# Patient Record
Sex: Female | Born: 1995 | Race: Black or African American | Hispanic: No | Marital: Single
Health system: Southern US, Community
[De-identification: ages and names within clinical notes are randomized; demographics above are authoritative.]

## PROBLEM LIST (undated history)

## (undated) DIAGNOSIS — I10 Essential (primary) hypertension: Secondary | ICD-10-CM

## (undated) DIAGNOSIS — L0291 Cutaneous abscess, unspecified: Secondary | ICD-10-CM

## (undated) DIAGNOSIS — F99 Mental disorder, not otherwise specified: Secondary | ICD-10-CM

## (undated) HISTORY — DX: Mental disorder, not otherwise specified: F99

## (undated) HISTORY — DX: Essential (primary) hypertension: I10

---
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
## 2016-01-15 ENCOUNTER — Ambulatory Visit
Admission: EM | Admit: 2016-01-15 | Discharge: 2016-01-15 | Disposition: A | Discharge status: Home / Self Care | Payer: BLUE CROSS/BLUE SHIELD | Attending: Family Medicine | Admitting: Family Medicine

## 2016-01-15 DIAGNOSIS — J020 Streptococcal pharyngitis: Secondary | ICD-10-CM | POA: Diagnosis not present

## 2016-01-15 LAB — RAPID STREP SCREEN (MED CTR MEBANE ONLY): Streptococcus, Group A Screen (Direct): POSITIVE — AB

## 2016-01-15 MED ORDER — DEXAMETHASONE SODIUM PHOSPHATE 10 MG/ML IJ SOLN
10.0000 mg | Freq: Once | INTRAMUSCULAR | Status: AC
  Administered 2016-01-15: 10 mg via INTRAMUSCULAR

## 2016-01-15 MED ORDER — AMOXICILLIN 875 MG PO TABS: 875.0000 mg | ORAL_TABLET | Freq: Two times a day (BID) | ORAL | 0 refills | Status: DC

## 2016-01-15 NOTE — ED Provider Notes (Signed)
[facility] ____________________________________________  Time seen: Approximately 8:49 AM  I have reviewed the triage vital signs and the nursing notes.   HISTORY  Chief Complaint Sore Throat   HPI Virginia Orozco is a 20 y.o. female presents with a complaint of sore throat 2 days. Patient reports yesterday she had a 101 fever accompanying this. Patient also feels that her lymph nodes in her neck are swollen. Denies any other complaints symptoms such as cough, nasal congestion, shortness breath, neck or back pain, rash, chest pain or abdominal pain.  Denies any known sick contacts. Reports does continue to drink fluids but not eating as much due to the complaints of sore throat. Patient states sore throat is moderate this time. Denies recent sickness, recent antibiotic use or recent hospitalization.  [facility] PCP  No LMP recorded. Patient has had an injection. States not sexually active. Denies chance of pregnancy. Depo-Provera use.    History reviewed. No pertinent past medical history.  There are no active problems to display for this patient.   History reviewed. No pertinent surgical history.  No current facility-administered medications for this encounter.   Current Outpatient Prescriptions:  .  medroxyPROGESTERone (DEPO-PROVERA) 150 MG/ML injection, Inject 150 mg into the muscle every 3 (three) months., Disp: , Rfl:  .  amoxicillin (AMOXIL) 875 MG tablet, Take 1 tablet (875 mg total) by mouth 2 (two) times daily., Disp: 20 tablet, Rfl: 0  Allergies Review of patient's allergies indicates no known allergies.  History reviewed. No pertinent family history.  Social History Social History  Substance Use Topics  . Smoking status: Never Smoker  . Smokeless tobacco: Never Used  . Alcohol use No    Review of Systems Constitutional: As above. Eyes: No visual changes. ENT: Positive sore throat. Cardiovascular: Denies chest pain. Respiratory:  Denies shortness of breath. Gastrointestinal: No abdominal pain.  No nausea, no vomiting.  No diarrhea.  No constipation. Genitourinary: Negative for dysuria. Musculoskeletal: Negative for back pain. Skin: Negative for rash. Neurological: Negative for headaches, focal weakness or numbness.  10-point ROS otherwise negative.  ____________________________________________   PHYSICAL EXAM:  VITAL SIGNS: ED Triage Vitals  Enc Vitals Group     BP 01/15/16 0833 (!) 136/97     Pulse Rate 01/15/16 0833 (!) 118 Recheck 98     Resp 01/15/16 0833 20     Temp 01/15/16 0833 98.1 F (36.7 C)     Temp Source 01/15/16 0833 Oral     SpO2 01/15/16 0833 100 %     Weight 01/15/16 0848 210 lb (95.3 kg)     Height 01/15/16 0848 5\' 5"  (1.651 m)     Head Circumference --      Peak Flow --      Pain Score 01/15/16 0838 8     Pain Loc --      Pain Edu? --      Excl. in [place]? --    Constitutional: Alert and oriented. Well appearing and in no acute distress. Eyes: Conjunctivae are normal. PERRL. EOMI. Head: Atraumatic. No sinus tenderness to palpation. No swelling. No erythema.  Ears: no erythema, normal TMs bilaterally.   Nose:No nasal congestion or rhinorrhea.  Mouth/Throat: Mucous membranes are moist. Moderate pharyngeal erythema, with 2+ bilateral tonsillar swelling and exudate. No uvular shift or deviation.  Neck: No stridor.  No cervical spine tenderness to palpation. Hematological/Lymphatic/Immunilogical: Anterior cervical lymphadenopathy. Cardiovascular: Normal rate, regular rhythm. Grossly normal heart sounds.  Good peripheral circulation. Respiratory: Normal respiratory  effort.  No retractions. Lungs CTAB.No wheezes, rales or rhonchi. Good air movement.  Gastrointestinal: Soft and nontender. Normal Bowel sounds. No CVA tenderness. No hepatomegaly or splenomegaly palpated.  Musculoskeletal: No lower or upper extremity tenderness nor edema. No cervical, thoracic or lumbar tenderness to  palpation. Neurologic:  Normal speech and language. No gross focal neurologic deficits are appreciated. No gait instability. Skin:  Skin is warm, dry and intact. No rash noted. Psychiatric: Mood and affect are normal. Speech and behavior are normal.  ___________________________________________   LABS (all labs ordered are listed, but only abnormal results are displayed)  Labs Reviewed  RAPID STREP SCREEN (NOT AT [facility]) - Abnormal; Notable for the following:       Result Value   Streptococcus, Group A Screen (Direct) POSITIVE (*)    All other components within normal limits   Procedures   _______________________________________   INITIAL IMPRESSION / ASSESSMENT AND PLAN / ED COURSE  Pertinent labs & imaging results that were available during my care of the patient were reviewed by me and considered in my medical decision making (see chart for details).  Well-appearing patient. No acute distress. Presents for complaints of sore throat 2 days with accompanying fever. Tolerating oral fluids in exam room. Exudative tonsillitis, suspect streptococcal pharyngitis. Quick strep positive. Discussed options of treatment with patient. 10 mg IM Decadron given once in urgent care. Patient requests oral treatment with amoxicillin. Encourage rest, fluids, over-the-counter Tylenol or ibuprofen as needed.Discussed indication, risks and benefits of medications with patient.  Discussed follow up with Primary care physician this week. Discussed follow up and return parameters including no resolution or any worsening concerns. Patient verbalized understanding and agreed to plan.   ____________________________________________   FINAL CLINICAL IMPRESSION(S) / ED DIAGNOSES  Final diagnoses:  Strep pharyngitis     Discharge Medication List as of 01/15/2016  8:58 AM    START taking these medications   Details  amoxicillin (AMOXIL) 875 MG tablet Take 1 tablet (875 mg total) by mouth 2 (two) times  daily., Starting Sun 01/15/2016, Normal        Note: This dictation was prepared with Dragon dictation along with smaller phrase technology. Any transcriptional errors that result from this process are unintentional.    Clinical Course      Renford Dills, NP 01/15/16 2841    Renford Dills, NP 01/15/16 [phone]

## 2016-01-15 NOTE — Discharge Instructions (Signed)
Take medication as prescribed. Rest. Drink plenty of fluids. Take over the counter tylenol or ibuprofen as needed.   Follow up with your primary care physician this week as needed. Return to Urgent care for new or worsening concerns.

## 2016-01-15 NOTE — ED Triage Notes (Signed)
Pt reports starting Friday with sore throat, worsening. Lymph nodes tender, has had fever and chills. No cough.

## 2016-01-16 ENCOUNTER — Telehealth: Payer: Self-pay | Admitting: *Deleted

## 2016-01-16 MED ORDER — PENICILLIN V POTASSIUM 500 MG PO TABS: 500.0000 mg | ORAL_TABLET | Freq: Two times a day (BID) | ORAL | 0 refills | Status: AC

## 2016-01-16 NOTE — Telephone Encounter (Signed)
Patient called and reported constant diarrhea since starting the amox 875 she was prescribed for strep throat. Patient was directed to stop taking amox and a new prescription for penn v was sent to her pharmacy on record. Patient notified that new prescription called into pharmacy.

## 2018-02-17 ENCOUNTER — Emergency Department (HOSPITAL_COMMUNITY): Payer: BLUE CROSS/BLUE SHIELD

## 2018-02-17 ENCOUNTER — Encounter (HOSPITAL_COMMUNITY): Payer: Self-pay | Admitting: Emergency Medicine

## 2018-02-17 ENCOUNTER — Emergency Department (HOSPITAL_COMMUNITY)
Admission: EM | Admit: 2018-02-17 | Discharge: 2018-02-17 | Disposition: A | Discharge status: Home / Self Care | Payer: BLUE CROSS/BLUE SHIELD | Attending: Emergency Medicine | Admitting: Emergency Medicine

## 2018-02-17 DIAGNOSIS — M549 Dorsalgia, unspecified: Secondary | ICD-10-CM

## 2018-02-17 DIAGNOSIS — M546 Pain in thoracic spine: Secondary | ICD-10-CM | POA: Insufficient documentation

## 2018-02-17 DIAGNOSIS — R109 Unspecified abdominal pain: Secondary | ICD-10-CM | POA: Diagnosis present

## 2018-02-17 DIAGNOSIS — M7918 Myalgia, other site: Secondary | ICD-10-CM | POA: Diagnosis not present

## 2018-02-17 DIAGNOSIS — Z79899 Other long term (current) drug therapy: Secondary | ICD-10-CM | POA: Diagnosis not present

## 2018-02-17 LAB — POC URINE PREG, ED: Preg Test, Ur: NEGATIVE

## 2018-02-17 LAB — URINALYSIS, ROUTINE W REFLEX MICROSCOPIC
Bilirubin Urine: NEGATIVE
Glucose, UA: NEGATIVE mg/dL
Hgb urine dipstick: NEGATIVE
KETONES UR: NEGATIVE mg/dL
LEUKOCYTES UA: NEGATIVE
NITRITE: NEGATIVE
PH: 5 (ref 5.0–8.0)
PROTEIN: NEGATIVE mg/dL
Specific Gravity, Urine: 1.018 (ref 1.005–1.030)

## 2018-02-17 MED ORDER — IBUPROFEN 200 MG PO TABS
600.0000 mg | ORAL_TABLET | Freq: Once | ORAL | Status: AC
  Administered 2018-02-17: 600 mg via ORAL
  Filled 2018-02-17: qty 1

## 2018-02-17 MED ORDER — IBUPROFEN 600 MG PO TABS: 600.0000 mg | ORAL_TABLET | Freq: Four times a day (QID) | ORAL | 0 refills | Status: DC | PRN

## 2018-02-17 MED ORDER — HYDROCODONE-ACETAMINOPHEN 5-325 MG PO TABS
1.0000 | ORAL_TABLET | Freq: Once | ORAL | Status: AC
  Administered 2018-02-17: 1 via ORAL
  Filled 2018-02-17: qty 1

## 2018-02-17 MED ORDER — CYCLOBENZAPRINE HCL 10 MG PO TABS: 10.0000 mg | ORAL_TABLET | Freq: Two times a day (BID) | ORAL | 0 refills | Status: DC | PRN

## 2018-02-17 MED ORDER — DIAZEPAM 5 MG PO TABS
5.0000 mg | ORAL_TABLET | Freq: Once | ORAL | Status: AC
  Administered 2018-02-17: 5 mg via ORAL
  Filled 2018-02-17: qty 1

## 2018-02-17 NOTE — ED Provider Notes (Signed)
[facility] Provider Note   CSN: [id number] Arrival date & time: 02/17/18  0309     History   Chief Complaint Chief Complaint  Patient presents with  . Flank Pain    HPI Virginia PatesMariah Orozco is a 22 y.o. female.  Patient without significant medical history presents with right flank pain x 3 days with intermittent radiation to low back. No AP, N, V, D, urinary symptoms. No fever, cough, SOB. Worse with movement, better with rest.  She describes constant pain that is intermittently intense and grabbing pain.   The history is provided by the patient. No language interpreter was used.    History reviewed. No pertinent past medical history.  There are no active problems to display for this patient.   History reviewed. No pertinent surgical history.   OB History   None      Home Medications    Prior to Admission medications   Medication Sig Start Date End Date Taking? Authorizing Provider  amoxicillin (AMOXIL) 875 MG tablet Take 1 tablet (875 mg total) by mouth 2 (two) times daily. 01/15/16   Renford DillsMiller, Lindsey, NP  medroxyPROGESTERone (DEPO-PROVERA) 150 MG/ML injection Inject 150 mg into the muscle every 3 (three) months.    [provider]    Family History No family history on file.  Social History Social History   Tobacco Use  . Smoking status: Never Smoker  . Smokeless tobacco: Never Used  Substance Use Topics  . Alcohol use: No  . Drug use: Not Currently     Allergies   Patient has no known allergies.   Review of Systems Review of Systems  Constitutional: Negative for chills and fever.  HENT: Negative.   Respiratory: Negative.  Negative for cough and shortness of breath.   Cardiovascular: Negative.  Negative for chest pain.  Gastrointestinal: Negative.  Negative for abdominal pain and nausea.  Genitourinary: Negative.  Negative for dysuria.  Musculoskeletal: Positive for back pain.  Skin: Negative.  Negative  for color change and rash.  Neurological: Negative.      Physical Exam Updated Vital Signs BP (!) 152/87   Pulse 85   Temp 98 F (36.7 C) (Oral)   Resp 16   Ht 5\' 5"  (1.651 m)   Wt 90.7 kg   SpO2 99%   BMI 33.28 kg/m   Physical Exam  Constitutional: She is oriented to person, place, and time. She appears well-developed and well-nourished. No distress.  HENT:  Head: Normocephalic.  Neck: Normal range of motion. Neck supple.  Cardiovascular: Normal rate and regular rhythm.  Pulmonary/Chest: Effort normal and breath sounds normal. She has no wheezes. She has no rales. She exhibits no tenderness.  Abdominal: Soft. Bowel sounds are normal. There is no tenderness. There is no rebound and no guarding.  Musculoskeletal: Normal range of motion.       Back:  Neurological: She is alert and oriented to person, place, and time.  Skin: Skin is warm and dry. No rash noted.  Psychiatric: She has a normal mood and affect.     ED Treatments / Results  Labs (all labs ordered are listed, but only abnormal results are displayed) Labs Reviewed  URINALYSIS, ROUTINE W REFLEX MICROSCOPIC  POC URINE PREG, ED    EKG None  Radiology No results found.  Procedures Procedures (including critical care time)  Medications Ordered in ED Medications - No data to display   Initial Impression / Assessment and Plan / ED Course  I have reviewed the triage vital signs and the nursing notes.  Pertinent labs & imaging results that were available during my care of the patient were reviewed by me and considered in my medical decision making (see chart for details).     Patient is here with 3 days of right sided back pain without known injury. No cough, fever, chest/abdominal pain.  The patient is PERC negative. The pain is reproducible and follows muscular pattern. UA negative.   Will treat symptomatically and encourage PCP follow up if symptoms persist.   Final Clinical Impressions(s) / ED  Diagnoses   Final diagnoses:  None  1. Back pain 2. Musculoskeletal pain  ED Discharge Orders    None       Elpidio Anis, Cordelia Poche 02/17/18 1610    Glynn Octave, MD 02/17/18 [phone]

## 2018-02-17 NOTE — ED Triage Notes (Signed)
Pt reports R sided flank pain since Friday. Denies urinary s/s. Hypertensive in triage.

## 2018-02-17 NOTE — ED Notes (Signed)
PT states understanding of care given, follow up care, and medication prescribed. PT ambulated from ED to car with a steady gait. 

## 2018-04-16 DIAGNOSIS — I10 Essential (primary) hypertension: Secondary | ICD-10-CM | POA: Insufficient documentation

## 2018-08-13 ENCOUNTER — Encounter (HOSPITAL_COMMUNITY): Payer: Self-pay

## 2018-08-13 ENCOUNTER — Emergency Department (HOSPITAL_COMMUNITY)
Admission: EM | Admit: 2018-08-13 | Discharge: 2018-08-13 | Discharge status: Against Medical Advice | Payer: BLUE CROSS/BLUE SHIELD | Attending: Emergency Medicine | Admitting: Emergency Medicine

## 2018-08-13 ENCOUNTER — Ambulatory Visit (HOSPITAL_COMMUNITY)
Admission: EM | Admit: 2018-08-13 | Discharge: 2018-08-13 | Disposition: A | Discharge status: Home / Self Care | Payer: BLUE CROSS/BLUE SHIELD | Source: Home / Self Care

## 2018-08-13 ENCOUNTER — Other Ambulatory Visit: Payer: Self-pay

## 2018-08-13 DIAGNOSIS — Z5321 Procedure and treatment not carried out due to patient leaving prior to being seen by health care provider: Secondary | ICD-10-CM | POA: Insufficient documentation

## 2018-08-13 DIAGNOSIS — R109 Unspecified abdominal pain: Secondary | ICD-10-CM | POA: Diagnosis present

## 2018-08-13 LAB — URINALYSIS, ROUTINE W REFLEX MICROSCOPIC
BILIRUBIN URINE: NEGATIVE
Glucose, UA: NEGATIVE mg/dL
Hgb urine dipstick: NEGATIVE
Ketones, ur: NEGATIVE mg/dL
LEUKOCYTE UA: NEGATIVE
NITRITE: NEGATIVE
PH: 7 (ref 5.0–8.0)
Protein, ur: NEGATIVE mg/dL
Specific Gravity, Urine: 1.02 (ref 1.005–1.030)

## 2018-08-13 LAB — I-STAT BETA HCG BLOOD, ED (MC, WL, AP ONLY)

## 2018-08-13 LAB — LIPASE, BLOOD: LIPASE: 24 U/L (ref 11–51)

## 2018-08-13 LAB — COMPREHENSIVE METABOLIC PANEL
ALT: 20 U/L (ref 0–44)
ANION GAP: 8 (ref 5–15)
AST: 25 U/L (ref 15–41)
Albumin: 4 g/dL (ref 3.5–5.0)
Alkaline Phosphatase: 42 U/L (ref 38–126)
BILIRUBIN TOTAL: 0.4 mg/dL (ref 0.3–1.2)
BUN: 8 mg/dL (ref 6–20)
CHLORIDE: 108 mmol/L (ref 98–111)
CO2: 24 mmol/L (ref 22–32)
Calcium: 9.3 mg/dL (ref 8.9–10.3)
Creatinine, Ser: 0.67 mg/dL (ref 0.44–1.00)
GFR calc non Af Amer: >60 mL/min (ref >60)
Glucose, Bld: 101 mg/dL — ABNORMAL HIGH (ref 70–99)
POTASSIUM: 3.8 mmol/L (ref 3.5–5.1)
Sodium: 140 mmol/L (ref 135–145)
Total Protein: 7 g/dL (ref 6.5–8.1)

## 2018-08-13 LAB — CBC
HEMATOCRIT: 37.7 % (ref 36.0–46.0)
Hemoglobin: 12.9 g/dL (ref 12.0–15.0)
MCH: 30.1 pg (ref 26.0–34.0)
MCHC: 34.2 g/dL (ref 30.0–36.0)
MCV: 87.9 fL (ref 80.0–100.0)
NRBC: 0 % (ref 0.0–0.2)
Platelets: 303 10*3/uL (ref 150–400)
RBC: 4.29 MIL/uL (ref 3.87–5.11)
RDW: 11.7 % (ref 11.5–15.5)
WBC: 19.8 10*3/uL — AB (ref 4.0–10.5)

## 2018-08-13 MED ORDER — SODIUM CHLORIDE 0.9% FLUSH: 3.0000 mL | Freq: Once | INTRAVENOUS | Status: DC

## 2018-08-13 NOTE — ED Triage Notes (Signed)
Pt reports abdominal pain that starts around her navel and radiates to her right flank area, describes as a stabbing pain. Pt also reports nausea that has resolved. Reports no BM in a couple of days. Pt reports urine has been dark.

## 2018-08-13 NOTE — ED Notes (Signed)
Called for room no answer X3

## 2018-08-13 NOTE — ED Notes (Signed)
Complains of sever right sided abdominal pain for an hour. Sudden onset.  Pain is constant.  No issues urination.  No vomiting, no diarrhea. Patient has nausea.  Pain is 8-10/10.  Patient appears to be in significant pain.  Patient has a friend with her.  Patient says it is painful in all positions.    Spoke with dr Milus Glazier about patient .  Patient agreed to go to ed for evaluation and possible imaging.

## 2019-02-12 ENCOUNTER — Emergency Department: Payer: BLUE CROSS/BLUE SHIELD

## 2019-02-12 ENCOUNTER — Other Ambulatory Visit: Payer: Self-pay

## 2019-02-12 ENCOUNTER — Emergency Department
Admission: EM | Admit: 2019-02-12 | Discharge: 2019-02-12 | Disposition: A | Discharge status: Home / Self Care | Payer: BLUE CROSS/BLUE SHIELD | Attending: Emergency Medicine | Admitting: Emergency Medicine

## 2019-02-12 ENCOUNTER — Encounter: Payer: Self-pay | Admitting: Emergency Medicine

## 2019-02-12 DIAGNOSIS — R109 Unspecified abdominal pain: Secondary | ICD-10-CM

## 2019-02-12 DIAGNOSIS — R630 Anorexia: Secondary | ICD-10-CM | POA: Insufficient documentation

## 2019-02-12 DIAGNOSIS — R1011 Right upper quadrant pain: Secondary | ICD-10-CM | POA: Diagnosis not present

## 2019-02-12 DIAGNOSIS — R1031 Right lower quadrant pain: Secondary | ICD-10-CM | POA: Insufficient documentation

## 2019-02-12 DIAGNOSIS — M549 Dorsalgia, unspecified: Secondary | ICD-10-CM | POA: Insufficient documentation

## 2019-02-12 DIAGNOSIS — R1010 Upper abdominal pain, unspecified: Secondary | ICD-10-CM

## 2019-02-12 LAB — COMPREHENSIVE METABOLIC PANEL
ALT: 19 U/L (ref 0–44)
AST: 21 U/L (ref 15–41)
Albumin: 4.4 g/dL (ref 3.5–5.0)
Alkaline Phosphatase: 38 U/L (ref 38–126)
Anion gap: 9 (ref 5–15)
BUN: 11 mg/dL (ref 6–20)
CO2: 23 mmol/L (ref 22–32)
Calcium: 9 mg/dL (ref 8.9–10.3)
Chloride: 105 mmol/L (ref 98–111)
Creatinine, Ser: 0.56 mg/dL (ref 0.44–1.00)
GFR calc Af Amer: >60 mL/min (ref >60)
GFR calc non Af Amer: >60 mL/min (ref >60)
Glucose, Bld: 95 mg/dL (ref 70–99)
Potassium: 3.6 mmol/L (ref 3.5–5.1)
Sodium: 137 mmol/L (ref 135–145)
Total Bilirubin: 0.7 mg/dL (ref 0.3–1.2)
Total Protein: 7.4 g/dL (ref 6.5–8.1)

## 2019-02-12 LAB — CBC
HCT: 38.1 % (ref 36.0–46.0)
Hemoglobin: 13.3 g/dL (ref 12.0–15.0)
MCH: 30.2 pg (ref 26.0–34.0)
MCHC: 34.9 g/dL (ref 30.0–36.0)
MCV: 86.6 fL (ref 80.0–100.0)
Platelets: 278 10*3/uL (ref 150–400)
RBC: 4.4 MIL/uL (ref 3.87–5.11)
RDW: 11.6 % (ref 11.5–15.5)
WBC: 10.3 10*3/uL (ref 4.0–10.5)
nRBC: 0 % (ref 0.0–0.2)

## 2019-02-12 LAB — URINALYSIS, COMPLETE (UACMP) WITH MICROSCOPIC
Bacteria, UA: NONE SEEN
Bilirubin Urine: NEGATIVE
Glucose, UA: NEGATIVE mg/dL
Hgb urine dipstick: NEGATIVE
Ketones, ur: NEGATIVE mg/dL
Leukocytes,Ua: NEGATIVE
Nitrite: NEGATIVE
Protein, ur: 30 mg/dL — AB
Specific Gravity, Urine: 1.029 (ref 1.005–1.030)
pH: 7 (ref 5.0–8.0)

## 2019-02-12 LAB — POCT PREGNANCY, URINE: Preg Test, Ur: NEGATIVE

## 2019-02-12 LAB — LIPASE, BLOOD: Lipase: 23 U/L (ref 11–51)

## 2019-02-12 NOTE — ED Triage Notes (Signed)
Patient presents to ED via POV from home with c/o abdominal pain. Patient reports the pain began yesterday and started around her umbilicus and radiated to her right flank. Patient denies nausea or vomiting. Patient reports negative pregnancy test two weeks ago.

## 2019-02-12 NOTE — ED Provider Notes (Signed)
[facility] Emergency Department Provider Note  ____________________________________________   First MD Initiated Contact with Patient 02/12/19 1416     (approximate)  I have reviewed the triage vital signs and the nursing notes.   HISTORY  Chief Complaint Abdominal Pain    HPI Virginia Orozco is a 23 y.o. female presents emergency department complaining of mid abdominal pain.  Patient states pain is been ongoing for 2 weeks.  It will come and go.  Is burning type pain.  To the right lower quadrant and moves into her back but will also be in the right upper quadrant and moved into her back.  She has decreased appetite.  No vomiting or diarrhea.  No fever or chills.  No vaginal discharge.    History reviewed. No pertinent past medical history.  There are no active problems to display for this patient.   History reviewed. No pertinent surgical history.  Prior to Admission medications   Medication Sig Start Date End Date Taking? Authorizing Provider  medroxyPROGESTERone (DEPO-PROVERA) 150 MG/ML injection Inject 150 mg into the muscle every 3 (three) months.    [provider]    Allergies Patient has no known allergies.  No family history on file.  Social History Social History   Tobacco Use  . Smoking status: Never Smoker  . Smokeless tobacco: Never Used  Substance Use Topics  . Alcohol use: No  . Drug use: Not Currently    Review of Systems  Constitutional: No fever/chills Eyes: No visual changes. ENT: No sore throat. Respiratory: Denies cough Gastrointestinal: Positive for abdominal pain Genitourinary: Negative for dysuria. Musculoskeletal: Negative for back pain. Skin: Negative for rash.    ____________________________________________   PHYSICAL EXAM:  VITAL SIGNS: ED Triage Vitals  Enc Vitals Group     BP 02/12/19 1215 (!) 152/86     Pulse Rate 02/12/19 1215 80     Resp --      Temp 02/12/19 1215 98.1 F (36.7  C)     Temp Source 02/12/19 1215 Oral     SpO2 02/12/19 1215 100 %     Weight 02/12/19 1216 200 lb (90.7 kg)     Height 02/12/19 1216 5\' 5"  (1.651 m)     Head Circumference --      Peak Flow --      Pain Score 02/12/19 1252 8     Pain Loc --      Pain Edu? --      Excl. in [place]? --     Constitutional: Alert and oriented. Well appearing and in no acute distress. Eyes: Conjunctivae are normal.  Head: Atraumatic. Nose: No congestion/rhinnorhea. Mouth/Throat: Mucous membranes are moist.   Neck:  supple no lymphadenopathy noted Cardiovascular: Normal rate, regular rhythm. Heart sounds are normal Respiratory: Normal respiratory effort.  No retractions, lungs c t a  Abd: soft tender right upper to mid quadrant, no McBurney's point tenderness, bs normal all 4 quad GU: deferred Musculoskeletal: FROM all extremities, warm and well perfused Neurologic:  Normal speech and language.  Skin:  Skin is warm, dry and intact. No rash noted. Psychiatric: Mood and affect are normal. Speech and behavior are normal.  ____________________________________________   LABS (all labs ordered are listed, but only abnormal results are displayed)  Labs Reviewed  URINALYSIS, COMPLETE (UACMP) WITH MICROSCOPIC - Abnormal; Notable for the following components:      Result Value   Color, Urine YELLOW (*)    APPearance HAZY (*)  Protein, ur 30 (*)    All other components within normal limits  LIPASE, BLOOD  COMPREHENSIVE METABOLIC PANEL  CBC  POC URINE PREG, ED  POCT PREGNANCY, URINE   ____________________________________________   ____________________________________________  RADIOLOGY  CT renal stone study is negative for appendicitis or other acute etiology.   Ultrasound right upper quadrant shows normal gallbladder  ____________________________________________   PROCEDURES  Procedure(s) performed: No  Procedures    ____________________________________________   INITIAL IMPRESSION  / ASSESSMENT AND PLAN / ED COURSE  Pertinent labs & imaging results that were available during my care of the patient were reviewed by me and considered in my medical decision making (see chart for details).   Patient is 23 year old female presents emergency department with intermittent right-sided abdominal pain.  Physical exam patient appears well.  Vitals are normal.  Abdomen is soft and tender in the right upper quadrant.  No McBurney's point tenderness.  POC pregnancy is negative, comprehensive metabolic panel is normal, CBC is normal, lipase is normal, urinalysis is normal.  CT renal stone study is negative for any acute abnormality and does show a normal appendix. Ultrasound right upper quadrant shows a normal gallbladder  Explained all the findings to the patient.  Explained to her this could be gas or another entity of abdominal pain.  If she continues to have abdominal pain she should follow-up with GI.  She states she understands will comply.  She was given a work note as requested and discharged in stable condition.    Virginia Orozco was evaluated in Emergency Department on 02/12/2019 for the symptoms described in the history of present illness. She was evaluated in the context of the global COVID-19 pandemic, which necessitated consideration that the patient might be at risk for infection with the SARS-CoV-2 virus that causes COVID-19. Institutional protocols and algorithms that pertain to the evaluation of patients at risk for COVID-19 are in a state of rapid change based on information released by regulatory bodies including the CDC and federal and state organizations. These policies and algorithms were followed during the patient's care in the ED.   As part of my medical decision making, I reviewed the following data within the electronic MEDICAL RECORD NUMBER Nursing notes reviewed and incorporated, Labs reviewed see above, Old chart reviewed, Radiograph reviewed see above, Notes from  prior ED visits and [place] Controlled Substance Database  ____________________________________________   FINAL CLINICAL IMPRESSION(S) / ED DIAGNOSES  Final diagnoses:  Abdominal pain  Pain of upper abdomen      NEW MEDICATIONS STARTED DURING THIS VISIT:  Discharge Medication List as of 02/12/2019  4:26 PM       Note:  This document was prepared using Dragon voice recognition software and may include unintentional dictation errors.    Faythe GheeFisher,  W, PA-C 02/12/19 1659    Arnaldo NatalMalinda, Paul F, MD 02/16/19 2255

## 2019-02-12 NOTE — ED Notes (Signed)
See triage note  Presents with abd pain   States pain is behind belly button ,which is described as "burning" pain    States pain is RLQ and moves into back

## 2019-02-12 NOTE — Discharge Instructions (Signed)
Follow-up with Dr. Allen Norris if not better in 3 to 4 days.  Return emergency department if worsening.

## 2019-06-10 ENCOUNTER — Other Ambulatory Visit: Payer: Self-pay

## 2019-06-10 DIAGNOSIS — Z20822 Contact with and (suspected) exposure to covid-19: Secondary | ICD-10-CM

## 2019-06-11 LAB — NOVEL CORONAVIRUS, NAA: SARS-CoV-2, NAA: DETECTED — AB

## 2019-06-12 ENCOUNTER — Encounter: Payer: Self-pay | Admitting: Critical Care Medicine

## 2019-06-12 ENCOUNTER — Telehealth: Payer: Self-pay | Admitting: Critical Care Medicine

## 2019-06-12 DIAGNOSIS — E669 Obesity, unspecified: Secondary | ICD-10-CM | POA: Insufficient documentation

## 2019-06-12 DIAGNOSIS — F419 Anxiety disorder, unspecified: Secondary | ICD-10-CM | POA: Insufficient documentation

## 2019-06-12 NOTE — Telephone Encounter (Signed)
I connected with this patient who is Covid + January 6 she has mild symptoms.  She is not a monoclonal antibody candidate.  She knows the isolation time for 10 days.

## 2019-06-12 NOTE — Telephone Encounter (Signed)
I tried to call this patient is Covid positive from January 6 testing event.  Was not able to reach the patient and I left a message on her voicemail.  She is not a monoclonal antibody candidate

## 2019-10-17 IMAGING — US US ABDOMEN LIMITED
1 series · 14 of 22 positions shown · non-contrast
Comparison: CT of the abdomen pelvis dated 02/12/2019

CLINICAL DATA: 23-year-old female with right upper quadrant
abdominal pain.

EXAM:
ULTRASOUND ABDOMEN LIMITED RIGHT UPPER QUADRANT

[Series 1: us abdomen limited · 14 of 22 slices shown]
[im 1/22]
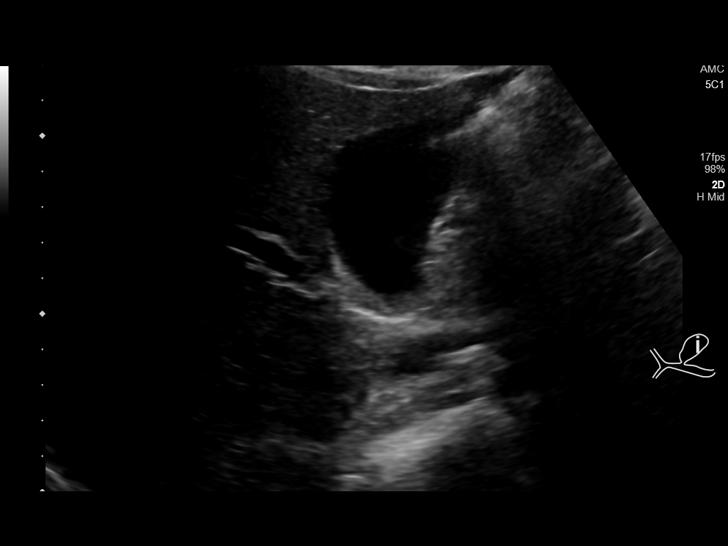
[im 3/22]
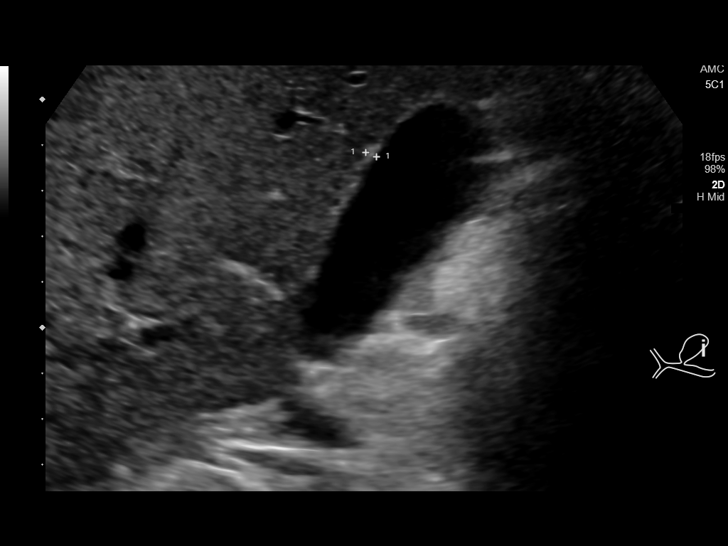
[im 4/22]
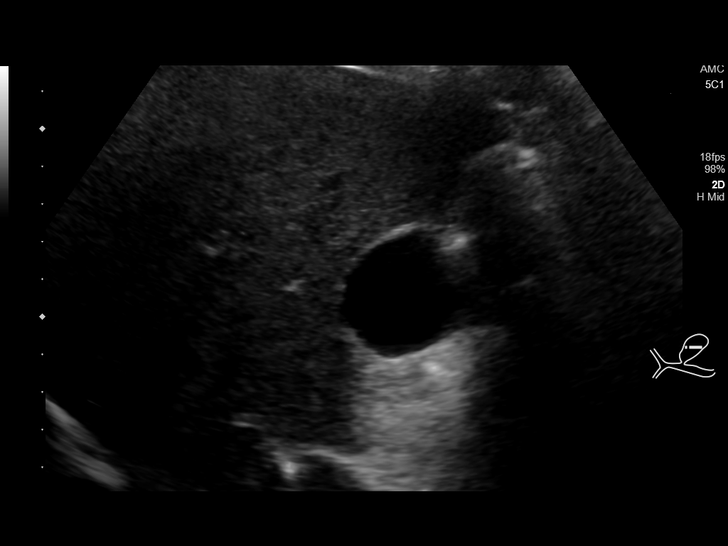
[im 6/22]
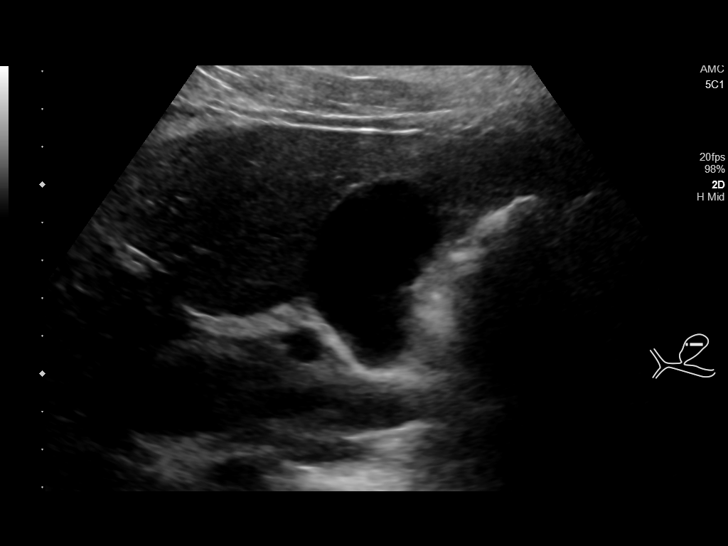
[im 8/22]
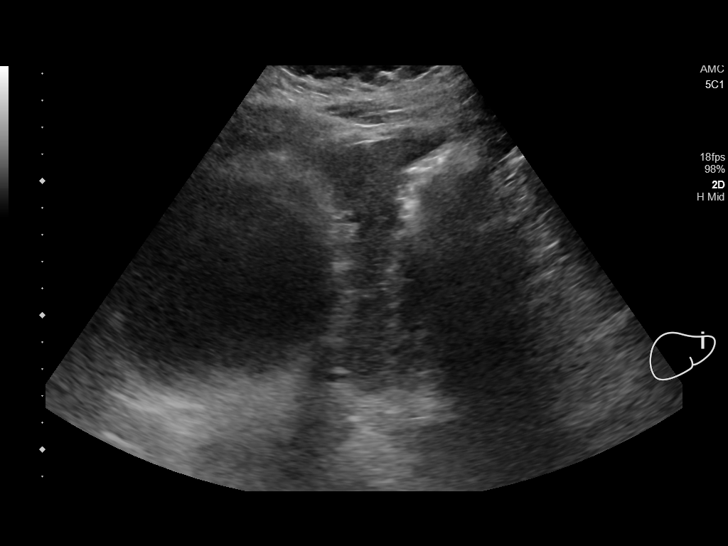
[im 9/22]
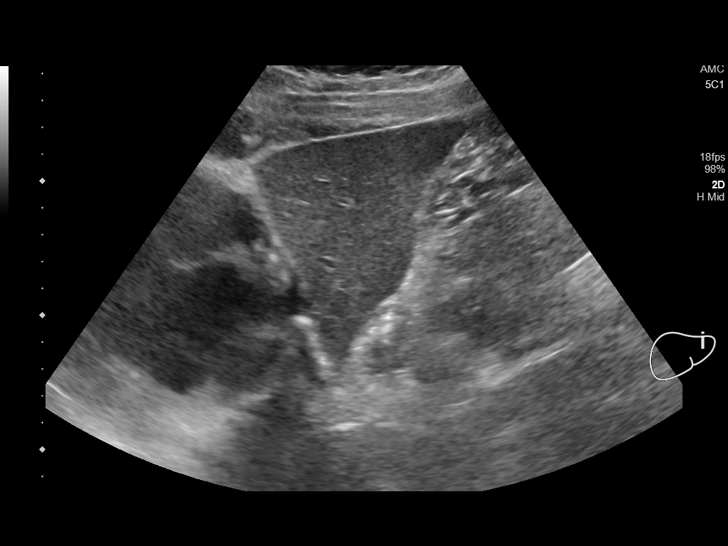
[im 11/22]
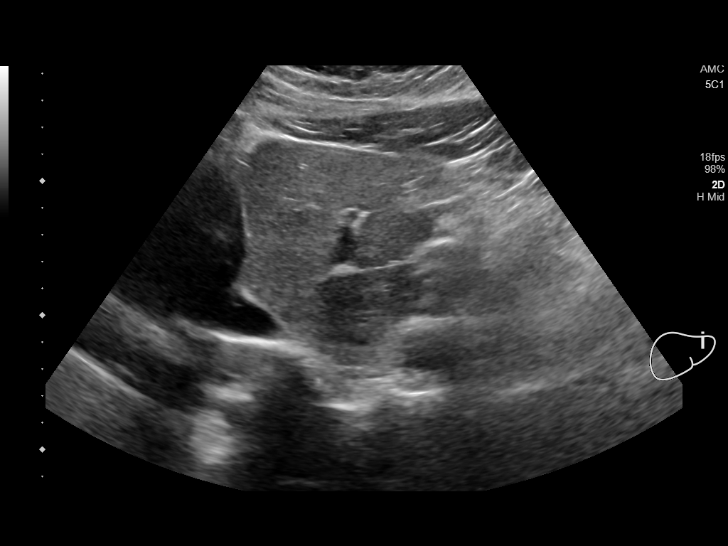
[im 12/22]
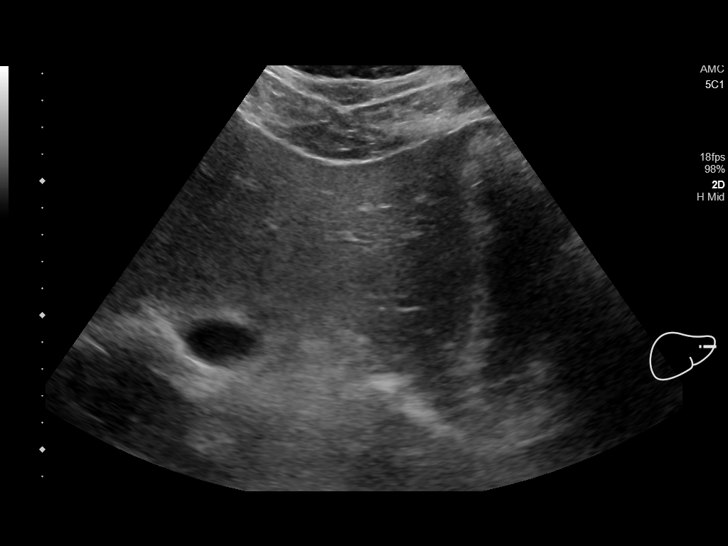
[im 14/22]
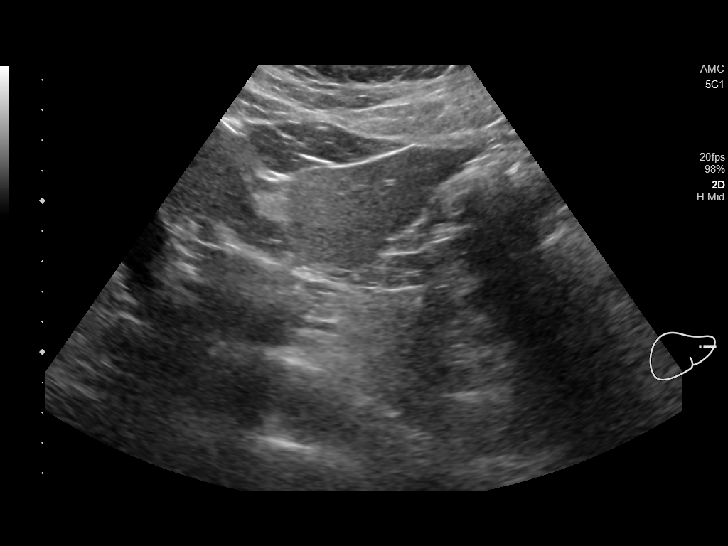
[im 15/22]
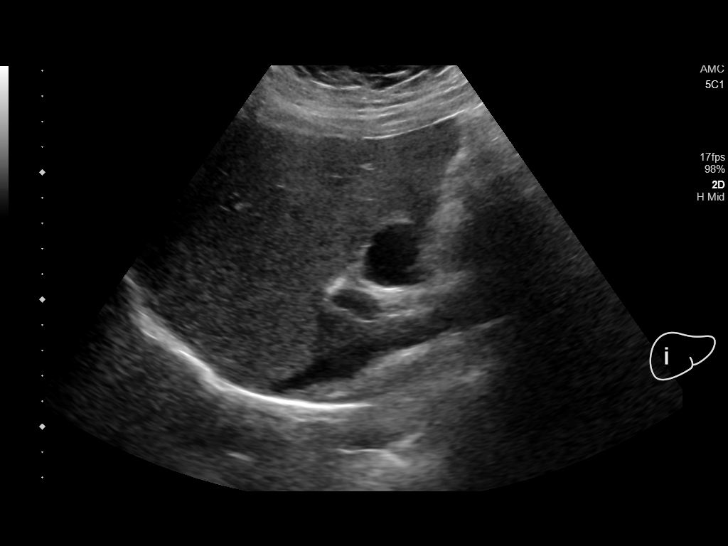
[im 17/22]
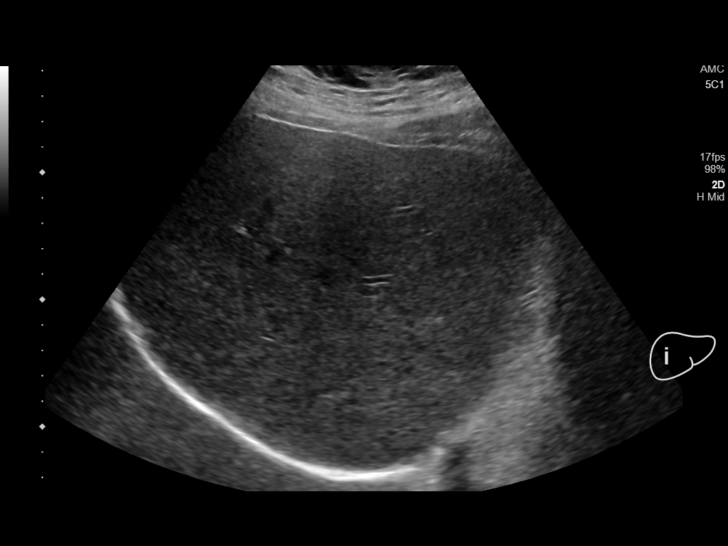
[im 19/22]
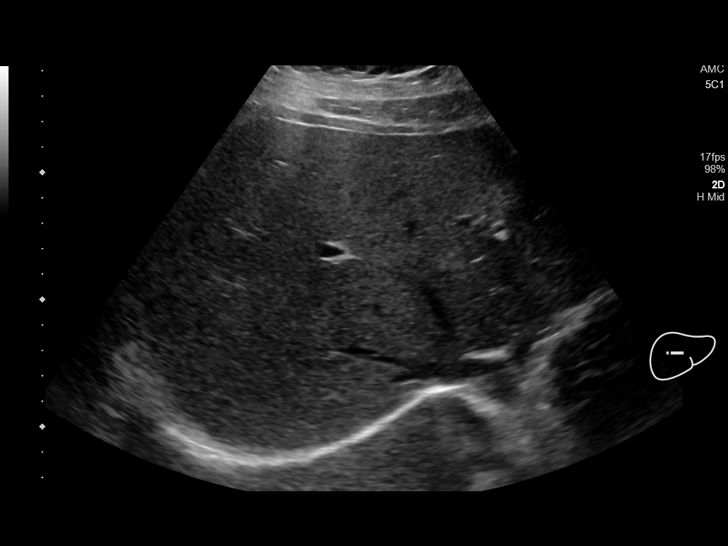
[im 20/22]
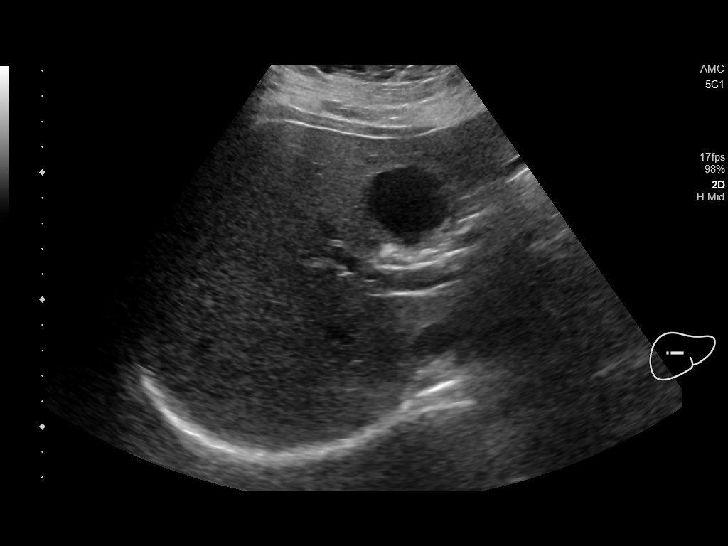
[im 22/22]
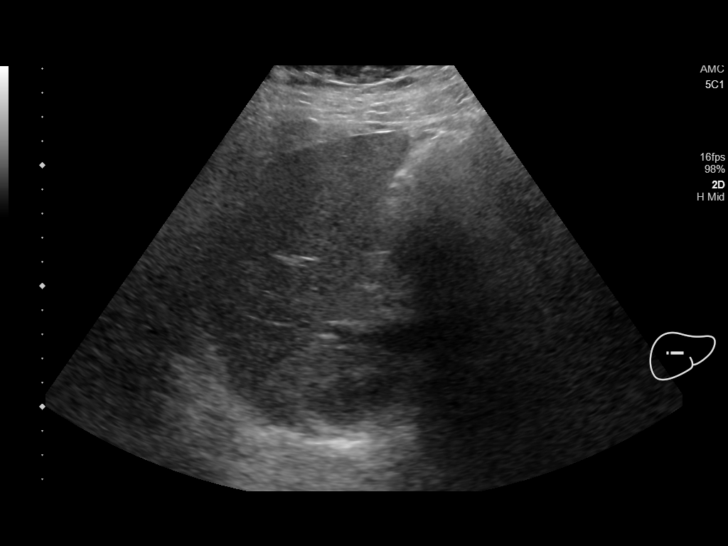

[14 of 22 positions shown; findings below may reference images not displayed]

FINDINGS: Gallbladder:

No gallstones or wall thickening visualized. No sonographic Murphy
sign noted by sonographer.

Common bile duct:

Diameter: 4 mm

Liver:

The liver is unremarkable as visualized. Portal vein is patent on
color Doppler imaging with normal direction of blood flow towards
the liver.

Other: None.
IMPRESSION: Unremarkable right upper quadrant ultrasound.

## 2019-10-17 IMAGING — CT CT RENAL STONE PROTOCOL
2 of 4 series · 16 of 46 positions shown, 18 images · non-contrast
Comparison: None.

CLINICAL DATA: Right flank pain for 1 day. Stone disease suspected.

EXAM:
CT ABDOMEN AND PELVIS WITHOUT CONTRAST
TECHNIQUE: Multidetector CT imaging of the abdomen and pelvis was performed
following the standard protocol without IV contrast.

[Series 2: stone full standard · axial · 0.73mm/px · z∈[-1047,-607]mm · 13 of 98 slices shown, 15 images]
[im 5/98  soft-tissue]
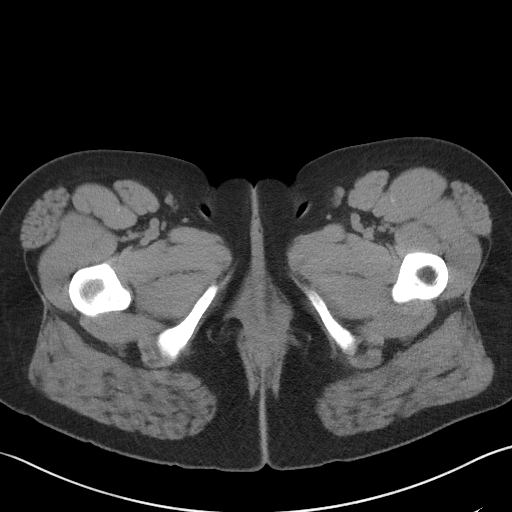
[im 5/98  bone]
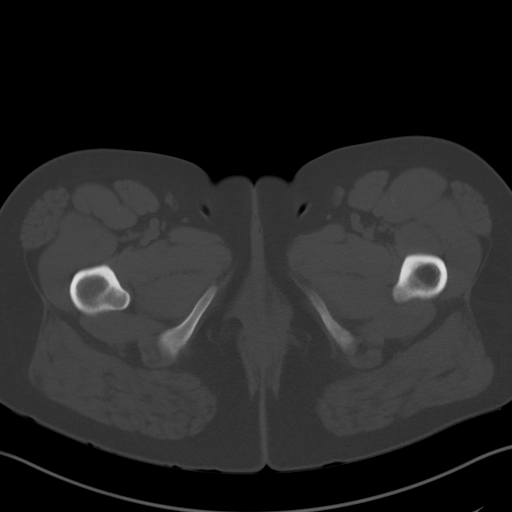
[im 13/98  soft-tissue]
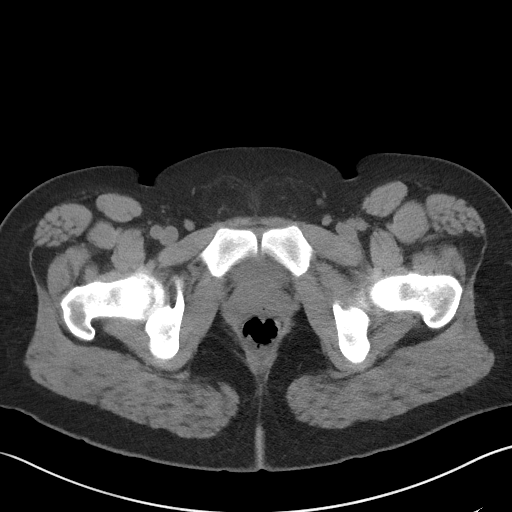
[im 21/98  soft-tissue]
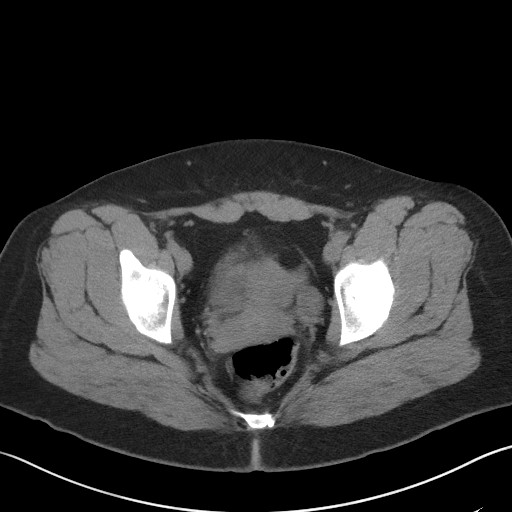
[im 29/98  soft-tissue]
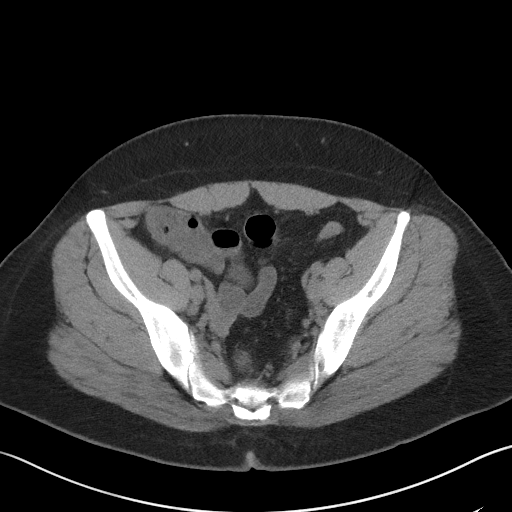
[im 33/98  soft-tissue]
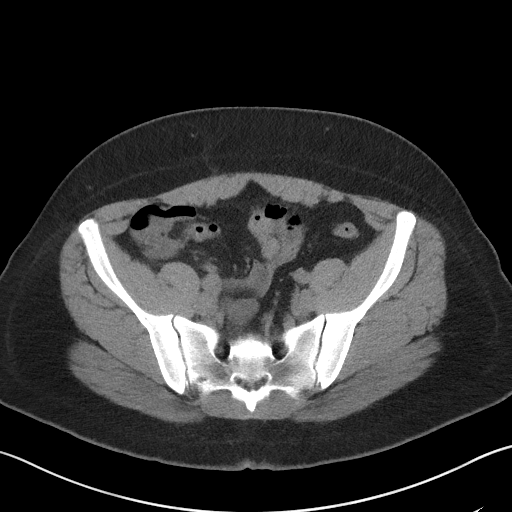
[im 41/98  soft-tissue]
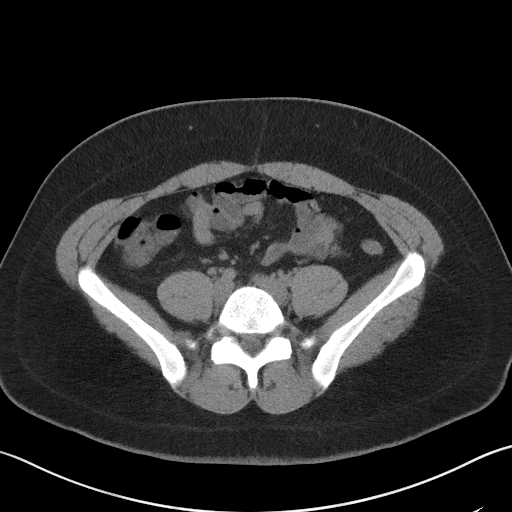
[im 49/98  soft-tissue]
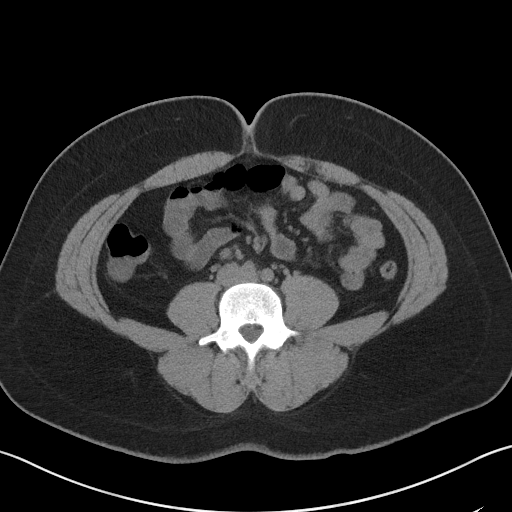
[im 57/98  soft-tissue]
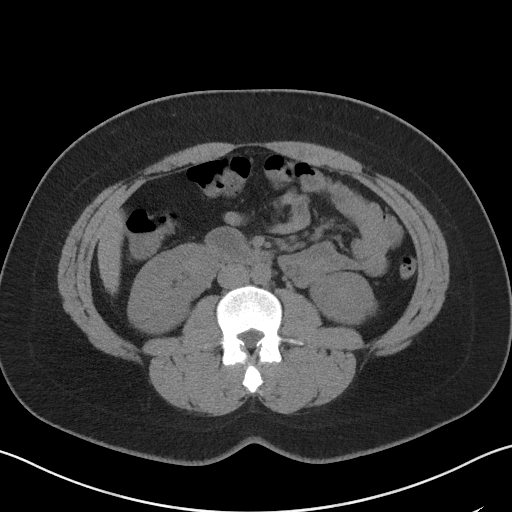
[im 65/98  soft-tissue]
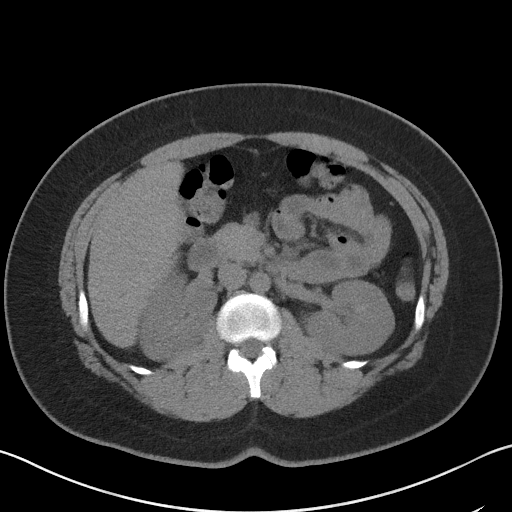
[im 65/98  bone]
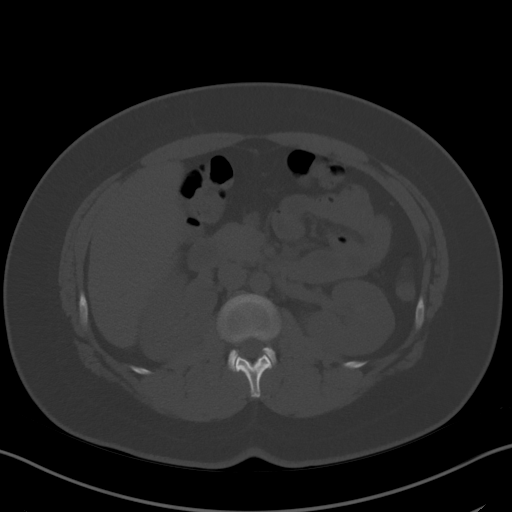
[im 69/98  soft-tissue]
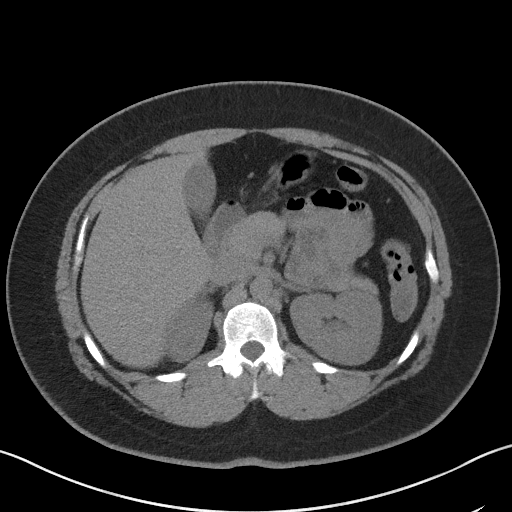
[im 77/98  soft-tissue]
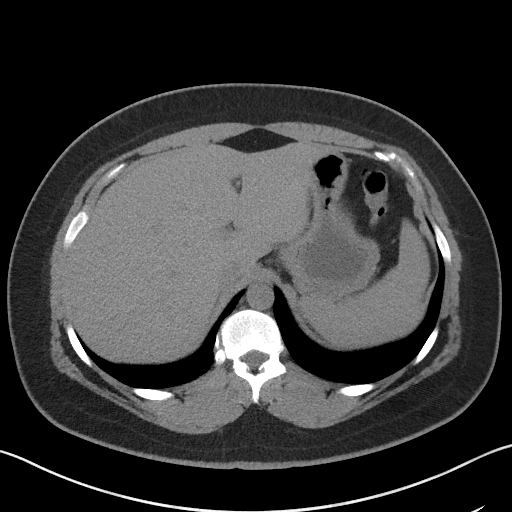
[im 85/98  soft-tissue]
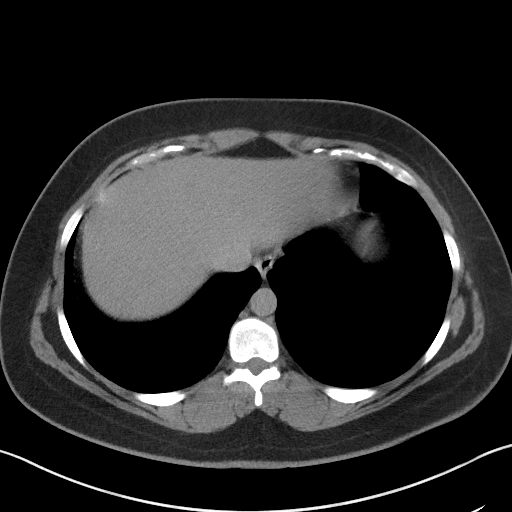
[im 93/98  soft-tissue]
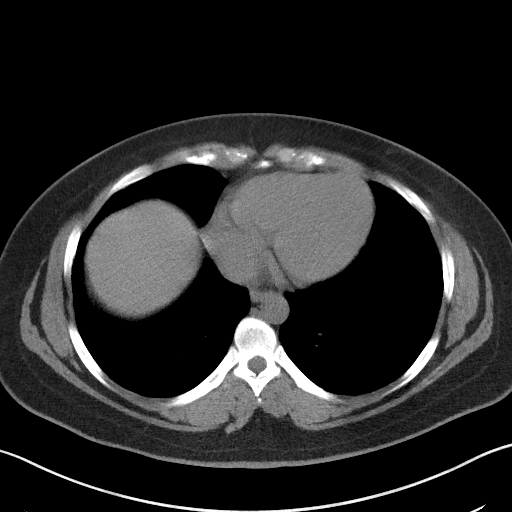

[Series 5: coronal · coronal · 0.76mm/px · 3 of 151 slices shown]
[im 51/151  soft-tissue]
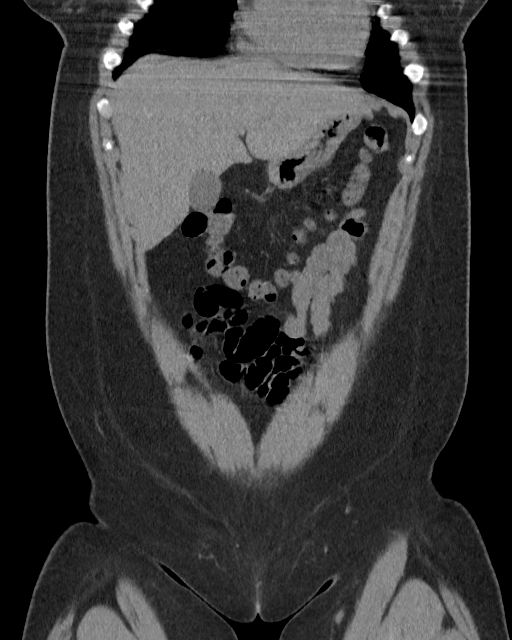
[im 67/151  soft-tissue]
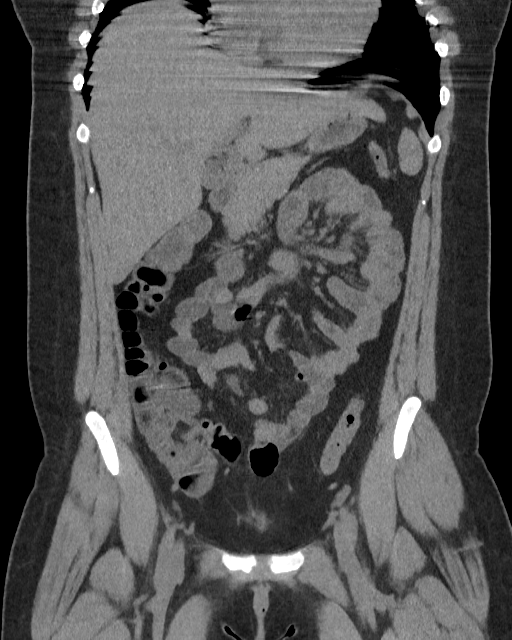
[im 84/151  soft-tissue]
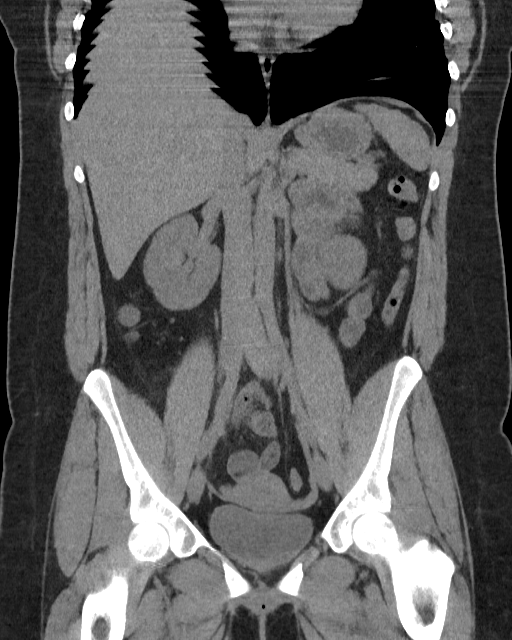

[16 of 46 positions shown; findings below may reference images not displayed]

FINDINGS: Lower chest: No significant pulmonary nodules or acute consolidative
airspace disease.

Hepatobiliary: Normal liver size. No liver mass. Normal gallbladder
with no radiopaque cholelithiasis. No biliary ductal dilatation.

Pancreas: Normal, with no mass or duct dilation.

Spleen: Normal size. No mass.

Adrenals/Urinary Tract: Normal adrenals. No renal stones. No
hydronephrosis. No contour deforming renal masses. Normal caliber
ureters. No ureteral stones. Normal bladder.

Stomach/Bowel: Normal non-distended stomach. Normal caliber small
bowel with no small bowel wall thickening. Normal appendix. Normal
large bowel with no diverticulosis, large bowel wall thickening or
pericolonic fat stranding.

Vascular/Lymphatic: Normal caliber abdominal aorta. No
pathologically enlarged lymph nodes in the abdomen or pelvis.

Reproductive: Grossly normal uterus.  No adnexal mass.

Other: No pneumoperitoneum, ascites or focal fluid collection.

Musculoskeletal: No aggressive appearing focal osseous lesions.
IMPRESSION: No acute abnormality. No urolithiasis. No hydronephrosis. No
evidence of bowel obstruction or acute bowel inflammation.

## 2019-11-07 ENCOUNTER — Encounter: Payer: Self-pay | Admitting: Emergency Medicine

## 2019-11-07 ENCOUNTER — Other Ambulatory Visit: Payer: Self-pay

## 2019-11-07 ENCOUNTER — Ambulatory Visit
Admission: EM | Admit: 2019-11-07 | Discharge: 2019-11-07 | Disposition: A | Discharge status: Home / Self Care | Payer: BLUE CROSS/BLUE SHIELD | Attending: Family Medicine | Admitting: Family Medicine

## 2019-11-07 DIAGNOSIS — R197 Diarrhea, unspecified: Secondary | ICD-10-CM | POA: Diagnosis not present

## 2019-11-07 DIAGNOSIS — L732 Hidradenitis suppurativa: Secondary | ICD-10-CM | POA: Diagnosis not present

## 2019-11-07 MED ORDER — DOXYCYCLINE HYCLATE 100 MG PO TABS: 100.0000 mg | ORAL_TABLET | Freq: Two times a day (BID) | ORAL | 0 refills | Status: DC

## 2019-11-07 NOTE — ED Triage Notes (Signed)
Patient states that she has an insect bite on her right thigh on Monday.  Patient c/o diarrhea and fatigue the next day.  Patient also reports that she has 4 boils under both arms for the past 2 days.  Patient denies fevers.

## 2019-11-07 NOTE — Discharge Instructions (Signed)
Rest, fluids, over the counter Imodium

## 2019-11-07 NOTE — ED Provider Notes (Signed)
[facility]    CSN: [id number] Arrival date & time: 11/07/19  1014      History   Chief Complaint Chief Complaint  Patient presents with  . Insect Bite  . Diarrhea  . Fatigue    HPI Virginia Orozco is a 24 y.o. female.   24 yo female with a c/o boils under her arms for the past 2-3 days. Denies any fevers, chills, drainage. States she's had these in the past. Also reports episodes of watery diarrhea on and off sinceTuesday after being bitten by a spider. Has been keep up with fluids. Denies any abdominal pain, melena, hematochezia, vomiting.    Diarrhea   History reviewed. No pertinent past medical history.  Patient Active Problem List   Diagnosis Date Noted  . Obesity (BMI 30-39.9) 06/12/2019  . Anxiety 06/12/2019    History reviewed. No pertinent surgical history.  OB History   No obstetric history on file.      Home Medications    Prior to Admission medications   Medication Sig Start Date End Date Taking? Authorizing Provider  medroxyPROGESTERone (DEPO-PROVERA) 150 MG/ML injection Inject 150 mg into the muscle every 3 (three) months.   Yes [provider]  venlafaxine XR (EFFEXOR-XR) 150 MG 24 hr capsule Take 150 mg by mouth daily. 09/09/19  Yes [provider]  doxycycline (VIBRA-TABS) 100 MG tablet Take 1 tablet (100 mg total) by mouth 2 (two) times daily. 11/07/19   Payton Mccallum, MD    Family History Family History  Problem Relation Age of Onset  . Hyperlipidemia Mother   . Diabetes Father     Social History Social History   Tobacco Use  . Smoking status: Never Smoker  . Smokeless tobacco: Never Used  Substance Use Topics  . Alcohol use: No  . Drug use: Not Currently     Allergies   Patient has no known allergies.   Review of Systems Review of Systems  Gastrointestinal: Positive for diarrhea.     Physical Exam Triage Vital Signs ED Triage Vitals  Enc Vitals Group     BP 11/07/19 1043 (!) 148/101   Pulse Rate 11/07/19 1043 77     Resp 11/07/19 1043 14     Temp 11/07/19 1043 98.6 F (37 C)     Temp Source 11/07/19 1043 Oral     SpO2 11/07/19 1043 97 %     Weight 11/07/19 1040 215 lb (97.5 kg)     Height 11/07/19 1040 5\' 5"  (1.651 m)     Head Circumference --      Peak Flow --      Pain Score 11/07/19 1039 7     Pain Loc --      Pain Edu? --      Excl. in GC? --    No data found.  Updated Vital Signs BP (!) 148/101 (BP Location: Right Arm)   Pulse 77   Temp 98.6 F (37 C) (Oral)   Resp 14   Ht 5\' 5"  (1.651 m)   Wt 97.5 kg   SpO2 97%   BMI 35.78 kg/m   Visual Acuity Right Eye Distance:   Left Eye Distance:   Bilateral Distance:    Right Eye Near:   Left Eye Near:    Bilateral Near:     Physical Exam Vitals and nursing note reviewed.  Constitutional:      General: She is not in acute distress.    Appearance: She is not toxic-appearing or  diaphoretic.  Cardiovascular:     Rate and Rhythm: Normal rate.  Pulmonary:     Effort: Pulmonary effort is normal. No respiratory distress.  Abdominal:     General: Bowel sounds are normal. There is no distension.     Palpations: Abdomen is soft.     Tenderness: There is no abdominal tenderness. There is no guarding.  Skin:    Findings: Lesion (tender, erythematous, subcutaneous lesions axillary skin) present.  Neurological:     Mental Status: She is alert.      UC Treatments / Results  Labs (all labs ordered are listed, but only abnormal results are displayed) Labs Reviewed - No data to display  EKG   Radiology No results found.  Procedures Procedures (including critical care time)  Medications Ordered in UC Medications - No data to display  Initial Impression / Assessment and Plan / UC Course  I have reviewed the triage vital signs and the nursing notes.  Pertinent labs & imaging results that were available during my care of the patient were reviewed by me and considered in my medical decision making  (see chart for details).      Final Clinical Impressions(s) / UC Diagnoses   Final diagnoses:  Hidradenitis suppurativa  Diarrhea, unspecified type     Discharge Instructions     Rest, fluids, over the counter Imodium    ED Prescriptions    Medication Sig Dispense Auth. Provider   doxycycline (VIBRA-TABS) 100 MG tablet Take 1 tablet (100 mg total) by mouth 2 (two) times daily. 20 tablet Norval Gable, MD     1. diagnosis reviewed with patient 2. rx as per orders above; reviewed possible side effects, interactions, risks and benefits  3. Recommend supportive treatment as above 4. Follow-up prn if symptoms worsen or don't improve   PDMP not reviewed this encounter.   Norval Gable, MD 11/07/19 1145

## 2019-12-29 ENCOUNTER — Other Ambulatory Visit: Payer: Self-pay

## 2019-12-29 ENCOUNTER — Ambulatory Visit (LOCAL_COMMUNITY_HEALTH_CENTER): Payer: BLUE CROSS/BLUE SHIELD | Admitting: Physician Assistant

## 2019-12-29 ENCOUNTER — Encounter: Payer: Self-pay | Admitting: Physician Assistant

## 2019-12-29 ENCOUNTER — Ambulatory Visit: Payer: Self-pay

## 2019-12-29 VITALS — BP 157/98 | Ht 66.0 in | Wt 217.0 lb

## 2019-12-29 DIAGNOSIS — Z0000 Encounter for general adult medical examination without abnormal findings: Secondary | ICD-10-CM | POA: Diagnosis not present

## 2019-12-29 DIAGNOSIS — Z30011 Encounter for initial prescription of contraceptive pills: Secondary | ICD-10-CM

## 2019-12-29 DIAGNOSIS — Z3009 Encounter for other general counseling and advice on contraception: Secondary | ICD-10-CM | POA: Diagnosis not present

## 2019-12-29 LAB — PREGNANCY, URINE: Preg Test, Ur: NEGATIVE

## 2019-12-29 MED ORDER — NORETHINDRONE 0.35 MG PO TABS: 1.0000 | ORAL_TABLET | Freq: Every day | ORAL | 3 refills | Status: DC

## 2019-12-29 NOTE — Progress Notes (Signed)
Pt is here for physical and Nexplanon insertion. Pt's BP is 157/98 and reports she is really nervous but not having any headaches or chest pain. Pt reports she had a physical at [facility] 6 or 7 months ago. Pt reports last Depo was ~09/2019 that was prescribed by [facility]. Pt reports last sex was ~2 months ago. Pt filling out PHQ9. Pt is currently taking Effexor. RN counseling and consult for Nexplanon completed, and consent forms reviewed and signed by pt.

## 2019-12-29 NOTE — Progress Notes (Signed)
UPT is negative today. Pt received Norethindrone #3 packs today per provider order. BP recheck 139/97 and Sadie Haber, PA made aware. Counseled pt per provider orders and pt states understanding. Pt aware to give [place] a call when she decides on the [facility] that she would like or if she has any issues with birth control pills and pt states understanding. Provider orders completed.

## 2020-01-01 ENCOUNTER — Encounter: Payer: Self-pay | Admitting: Physician Assistant

## 2020-01-01 NOTE — Progress Notes (Signed)
Family Planning Visit- Repeat Yearly Visit  Subjective:  Virginia Orozco is a 24 y.o. No obstetric history on file.  being seen today for an well woman visit and to discuss family planning options.    She is currently using Depo Provera for pregnancy prevention. Patient reports she does not  want a pregnancy in the next year. Patient  has Obesity (BMI 30-39.9); Anxiety; and Hypertension on their problem list.  Chief Complaint  Patient presents with   Contraception    Physical and Nexplanon    Patient reports that she would maybe like a Nexplanon for [facility] but is not sure whether she wants that or an IUD.  Patient states that she had a PE and started on Depo in about October and got her last Depo in April.  States that she has not liked the Depo due to causing vaginal dryness.  States that she is in care for anxiety and denies S or H ideation or plan.  PHQ-9=2 today.    Patient denies any concerns today.   See flowsheet for other program required questions.   Body mass index is 35.02 kg/m. - Patient is eligible for diabetes screening based on BMI and age >35?  not applicable HA1C ordered? not applicable  Patient reports 1 of partners in last year. Desires STI screening?  No - patient declines.   Has patient been screened once for HCV in the past?  No  No results found for: HCVAB  Does the patient have current of drug use, have a partner with drug use, and/or has been incarcerated since last result? No  If yes-- Screen for HCV through [facility]   Does the patient meet criteria for HBV testing? No  Criteria:  -Household, sexual or needle sharing contact with HBV -History of drug use -HIV positive -Those with known Hep C   Health Maintenance Due  Topic Date Due   Hepatitis C Screening  Never done   COVID-19 Vaccine (1) Never done   HIV Screening  Never done   PAP-Cervical Cytology Screening  Never done   PAP SMEAR-Modifier  Never done    Review of Systems  All other  systems reviewed and are negative.   The following portions of the patient's history were reviewed and updated as appropriate: allergies, current medications, past family history, past medical history, past social history, past surgical history and problem list. Problem list updated.  Objective:   Vitals:   12/29/19 1046  BP: (!) 157/98  Weight: (!) 217 lb (98.4 kg)  Height: 5\' 6"  (1.676 m)    Physical Exam Vitals and nursing note reviewed.  Constitutional:      General: She is not in acute distress.    Appearance: Normal appearance.  HENT:     Head: Normocephalic and atraumatic.  Eyes:     Conjunctiva/sclera: Conjunctivae normal.  Neck:     Thyroid: No thyroid mass, thyromegaly or thyroid tenderness.  Cardiovascular:     Rate and Rhythm: Normal rate and regular rhythm.  Pulmonary:     Effort: Pulmonary effort is normal.     Breath sounds: Normal breath sounds.  Abdominal:     Palpations: Abdomen is soft. There is no mass.     Tenderness: There is no abdominal tenderness. There is no guarding or rebound.  Musculoskeletal:     Cervical back: Neck supple. No tenderness.  Lymphadenopathy:     Cervical: No cervical adenopathy.  Skin:    General: Skin is warm and  dry.  Neurological:     Mental Status: She is alert and oriented to person, place, and time.  Psychiatric:        Mood and Affect: Mood normal.        Behavior: Behavior normal.        Thought Content: Thought content normal.        Judgment: Judgment normal.       Assessment and Plan:  Virginia Orozco is a 24 y.o. female No obstetric history on file. presenting to the [facility] Department for an yearly well woman exam/family planning visit  Contraception counseling: Reviewed all forms of birth control options in the tiered based approach. available including abstinence; over the counter/barrier methods; hormonal contraceptive medication including pill, patch, ring, injection,contraceptive implant,  ECP; hormonal and nonhormonal IUDs; permanent sterilization options including vasectomy and the various tubal sterilization modalities. Risks, benefits, and typical effectiveness rates were reviewed.  Questions were answered.  Written information was also given to the patient to review.  Patient is not sure whether she wants Nexplanon or IUD so opts to try OCs until she decides, this was prescribed for patient. She will follow up in  2-3 months for surveillance.  She was told to call with any further questions, or with any concerns about this method of contraception.  Emphasized use of condoms 100% of the time for STI prevention.  Patient was not a candidate for ECP today.    1. Encounter for counseling regarding contraception Patient is not sure whether she wants Nexplanon or an IUD today. Give written information about both to patient to review and decide. Counseled patient to call back once she decides which she wants or RTC for more OCs if she decides to continue with them. - Pregnancy, urine  2. Well woman exam (no gynecological exam) Reviewed with patient healthy habits for general health. Rec MVI 1 po daily. Enc to follow up with PCP and Mental Health provider per recs.  3. OCP (oral contraceptive pills) initiation OK to start Norethindrone 28 d 1 po daily at the same time daily today. Rec condoms with all sex for 2 weeks of first cycle and if late OCs. - norethindrone (MICRONOR) 0.35 MG tablet; Take 1 tablet (0.35 mg total) by mouth daily.  Dispense: 28 tablet; Refill: 3     Return for when decides on desired BCM, prior to running out of birth control pills, annual and PRN.  No future appointments.  Matt Holmes, PA

## 2020-03-21 ENCOUNTER — Telehealth: Payer: Self-pay | Admitting: Family Medicine

## 2020-03-21 DIAGNOSIS — Z3041 Encounter for surveillance of contraceptive pills: Secondary | ICD-10-CM

## 2020-03-21 MED ORDER — NORETHINDRONE 0.35 MG PO TABS: 1.0000 | ORAL_TABLET | Freq: Every day | ORAL | 10 refills | Status: DC

## 2020-03-21 NOTE — Telephone Encounter (Signed)
Pt would like refills for OCP sent to [facility] [place], [place] [address].  Pt states she is happy with OCP, no problems with it and denies missing pills, if late by only 10 - 30 min. Pt states she only has one pill left.  Pt counseled that request will be sent to provider and if there are any problems, someone will contact her again.

## 2020-03-21 NOTE — Addendum Note (Signed)
Addended by: Sadie Haber on: 03/21/2020 04:25 PM   Modules accepted: Orders

## 2020-03-21 NOTE — Telephone Encounter (Signed)
Patient called and states that she would like to continue with Norethindrone as her BCM.  Will send refills to pharmacy for her to continue with this OC until her annual visit is due.

## 2020-10-28 ENCOUNTER — Encounter (HOSPITAL_BASED_OUTPATIENT_CLINIC_OR_DEPARTMENT_OTHER): Payer: Self-pay | Admitting: *Deleted

## 2020-10-28 ENCOUNTER — Emergency Department (HOSPITAL_BASED_OUTPATIENT_CLINIC_OR_DEPARTMENT_OTHER)
Admission: EM | Admit: 2020-10-28 | Discharge: 2020-10-28 | Disposition: A | Discharge status: Home / Self Care | Payer: BLUE CROSS/BLUE SHIELD | Attending: Emergency Medicine | Admitting: Emergency Medicine

## 2020-10-28 ENCOUNTER — Other Ambulatory Visit: Payer: Self-pay

## 2020-10-28 DIAGNOSIS — Z87891 Personal history of nicotine dependence: Secondary | ICD-10-CM | POA: Insufficient documentation

## 2020-10-28 DIAGNOSIS — I10 Essential (primary) hypertension: Secondary | ICD-10-CM | POA: Diagnosis not present

## 2020-10-28 DIAGNOSIS — M5441 Lumbago with sciatica, right side: Secondary | ICD-10-CM | POA: Diagnosis not present

## 2020-10-28 DIAGNOSIS — M545 Low back pain, unspecified: Secondary | ICD-10-CM | POA: Diagnosis present

## 2020-10-28 LAB — PREGNANCY, URINE: Preg Test, Ur: NEGATIVE

## 2020-10-28 MED ORDER — MELOXICAM 7.5 MG PO TABS: 7.5000 mg | ORAL_TABLET | Freq: Every day | ORAL | 0 refills | Status: DC

## 2020-10-28 MED ORDER — KETOROLAC TROMETHAMINE 60 MG/2ML IM SOLN
60.0000 mg | Freq: Once | INTRAMUSCULAR | Status: AC
  Administered 2020-10-28: 60 mg via INTRAMUSCULAR
  Filled 2020-10-28: qty 2

## 2020-10-28 NOTE — ED Notes (Signed)
Pt ambulated to BR to obtain urine sample.

## 2020-10-28 NOTE — ED Provider Notes (Signed)
[facility] EMERGENCY DEPT Provider Note   CSN: [id number] Arrival date & time: 10/28/20  1008     History Chief Complaint  Patient presents with  . Back Pain    Virginia Orozco is a 25 y.o. female.  Patient is a 25 year old female with a history of hypertension although she has not currently on medication for this, anxiety and depression who presents with back pain.  She states that the last 2 days she has had some pain in her right lower back rating to her right buttocks.  There is no radiation down her legs.  No numbness or weakness to her legs.  She says it hurts when she sits on it or walks but feels okay when she is laying flat.  There is no associated abdominal pain.  No urinary symptoms.  No fevers.  No recent injuries.  No history of similar symptoms in the past.  She is currently on her menstrual cycle.  She says its a bit heavier than it normally is but it is at the expected time.  She has been using ibuprofen without improvement in symptoms.        Past Medical History:  Diagnosis Date  . Hypertension    pt reports in the past, but not currently on medicine for  . Mental disorder    anxiety and depression    Patient Active Problem List   Diagnosis Date Noted  . Obesity (BMI 30-39.9) 06/12/2019  . Anxiety 06/12/2019  . Hypertension 04/16/2018    History reviewed. No pertinent surgical history.   OB History   No obstetric history on file.     Family History  Problem Relation Age of Onset  . Hyperlipidemia Mother   . Hypertension Mother   . Diabetes Father   . Anxiety disorder Sister   . Depression Sister   . Pancreatitis Sister   . Diabetes Maternal Grandfather   . Breast cancer Paternal Grandmother   . Diabetes Paternal Grandfather   . Heart disease Paternal Grandfather   . Stroke Paternal Grandfather   . Pancreatitis Brother     Social History   Tobacco Use  . Smoking status: Former Smoker    Types: E-cigarettes  . Smokeless  tobacco: Never Used  . Tobacco comment: pt reports cigarrette or vape use occassionally  Vaping Use  . Vaping Use: Never used  Substance Use Topics  . Alcohol use: Yes    Comment: mixed drink once a week or every 2 weeks  . Drug use: Not Currently    Home Medications Prior to Admission medications   Medication Sig Start Date End Date Taking? Authorizing Provider  meloxicam (MOBIC) 7.5 MG tablet Take 1 tablet (7.5 mg total) by mouth daily. 10/28/20  Yes Rolan Bucco, MD  doxycycline (VIBRA-TABS) 100 MG tablet Take 1 tablet (100 mg total) by mouth 2 (two) times daily. Patient not taking: Reported on 12/29/2019 11/07/19   Payton Mccallum, MD  ibuprofen (ADVIL) 600 MG tablet Take 600 mg by mouth every 6 (six) hours as needed. Pt reports taking as needed    [provider]  medroxyPROGESTERone (DEPO-PROVERA) 150 MG/ML injection Inject 150 mg into the muscle every 3 (three) months.    [provider]  norethindrone (MICRONOR) 0.35 MG tablet Take 1 tablet (0.35 mg total) by mouth daily. 12/29/19   Matt Holmes, PA  norethindrone (MICRONOR) 0.35 MG tablet Take 1 tablet (0.35 mg total) by mouth daily. 03/21/20   Matt Holmes, PA  venlafaxine  XR (EFFEXOR-XR) 150 MG 24 hr capsule Take 150 mg by mouth daily. 09/09/19   [provider]    Allergies    Iodides and Shellfish allergy  Review of Systems   Review of Systems  Constitutional: Negative for chills, diaphoresis, fatigue and fever.  HENT: Negative for congestion, rhinorrhea and sneezing.   Eyes: Negative.   Respiratory: Negative for cough, chest tightness and shortness of breath.   Cardiovascular: Negative for chest pain and leg swelling.  Gastrointestinal: Negative for abdominal pain, blood in stool, diarrhea, nausea and vomiting.  Genitourinary: Positive for vaginal bleeding. Negative for difficulty urinating, flank pain, frequency and hematuria.  Musculoskeletal: Positive for back pain. Negative for  arthralgias.  Skin: Negative for rash.  Neurological: Negative for dizziness, speech difficulty, weakness, numbness and headaches.    Physical Exam Updated Vital Signs BP 130/74   Pulse 70   Temp 98.3 F (36.8 C) (Oral)   Resp 16   Ht 5\' 5"  (1.651 m)   Wt 99.8 kg   LMP 10/27/2020   SpO2 99%   BMI 36.61 kg/m   Physical Exam Constitutional:      Appearance: She is well-developed.  HENT:     Head: Normocephalic and atraumatic.  Eyes:     Pupils: Pupils are equal, round, and reactive to light.  Cardiovascular:     Rate and Rhythm: Normal rate and regular rhythm.     Heart sounds: Normal heart sounds.  Pulmonary:     Effort: Pulmonary effort is normal. No respiratory distress.     Breath sounds: Normal breath sounds. No wheezing or rales.  Chest:     Chest wall: No tenderness.  Abdominal:     General: Bowel sounds are normal.     Palpations: Abdomen is soft.     Tenderness: There is no abdominal tenderness. There is no guarding or rebound.  Musculoskeletal:        General: Normal range of motion.     Cervical back: Normal range of motion and neck supple.     Comments: Positive tenderness in the right lower lumbar paraspinal area and along the right sciatic nerve.  No step-offs or deformities.  Negative straight leg raise bilaterally.  Patellar reflexes symmetric bilaterally.  Pedal pulses are intact.  She has normal sensation and motor function distally.  Lymphadenopathy:     Cervical: No cervical adenopathy.  Skin:    General: Skin is warm and dry.     Findings: No rash.  Neurological:     Mental Status: She is alert and oriented to person, place, and time.     ED Results / Procedures / Treatments   Labs (all labs ordered are listed, but only abnormal results are displayed) Labs Reviewed  PREGNANCY, URINE    EKG None  Radiology No results found.  Procedures Procedures   Medications Ordered in ED Medications  ketorolac (TORADOL) injection 60 mg (60 mg  Intramuscular Given 10/28/20 1055)    ED Course  I have reviewed the triage vital signs and the nursing notes.  Pertinent labs & imaging results that were available during my care of the patient were reviewed by me and considered in my medical decision making (see chart for details).    MDM Rules/Calculators/A&P                          Patient presents with pain in her right lower back and sciatic nerve area.  She has no  neurologic deficits.  No recent trauma.  No suggestions of cauda equina.  She is feeling better after Toradol.  She was discharged home in good condition.  She was advised to follow-up with her PCP if her symptoms are improving.  Return precautions were given.  She was started on meloxicam. Final Clinical Impression(s) / ED Diagnoses Final diagnoses:  Acute right-sided low back pain with right-sided sciatica    Rx / DC Orders ED Discharge Orders         Ordered    meloxicam (MOBIC) 7.5 MG tablet  Daily        10/28/20 1202           Rolan Bucco, MD 10/28/20 1204

## 2020-10-28 NOTE — ED Triage Notes (Signed)
Rt lower back pain to "butt cheek", pt states she also started her period and does not know if it is related. No urinary symptoms. Movement increases the pain. Heavy fow with period.

## 2021-03-24 ENCOUNTER — Emergency Department (HOSPITAL_BASED_OUTPATIENT_CLINIC_OR_DEPARTMENT_OTHER)
Admission: EM | Admit: 2021-03-24 | Discharge: 2021-03-24 | Disposition: A | Discharge status: Home / Self Care | Payer: BLUE CROSS/BLUE SHIELD | Attending: Emergency Medicine | Admitting: Emergency Medicine

## 2021-03-24 ENCOUNTER — Encounter (HOSPITAL_BASED_OUTPATIENT_CLINIC_OR_DEPARTMENT_OTHER): Payer: Self-pay | Admitting: Emergency Medicine

## 2021-03-24 ENCOUNTER — Other Ambulatory Visit: Payer: Self-pay

## 2021-03-24 DIAGNOSIS — I10 Essential (primary) hypertension: Secondary | ICD-10-CM | POA: Diagnosis not present

## 2021-03-24 DIAGNOSIS — N611 Abscess of the breast and nipple: Secondary | ICD-10-CM | POA: Diagnosis not present

## 2021-03-24 HISTORY — DX: Cutaneous abscess, unspecified: L02.91

## 2021-03-24 MED ORDER — HYDROCODONE-ACETAMINOPHEN 5-325 MG PO TABS
1.0000 | ORAL_TABLET | Freq: Once | ORAL | Status: AC
  Administered 2021-03-24: 1 via ORAL
  Filled 2021-03-24: qty 1

## 2021-03-24 MED ORDER — NAPROXEN 375 MG PO TABS: 375.0000 mg | ORAL_TABLET | Freq: Two times a day (BID) | ORAL | 0 refills | Status: AC

## 2021-03-24 MED ORDER — LIDOCAINE-EPINEPHRINE (PF) 2 %-1:200000 IJ SOLN
10.0000 mL | Freq: Once | INTRAMUSCULAR | Status: AC
  Administered 2021-03-24: 10 mL
  Filled 2021-03-24: qty 20

## 2021-03-24 MED ORDER — AMOXICILLIN-POT CLAVULANATE 875-125 MG PO TABS: 1.0000 | ORAL_TABLET | Freq: Two times a day (BID) | ORAL | 0 refills | Status: AC

## 2021-03-24 NOTE — ED Provider Notes (Signed)
[facility] EMERGENCY DEPT Provider Note   CSN: [id number] Arrival date & time: 03/24/21  1027     History Chief Complaint  Patient presents with   Abscess    Virginia Orozco is a 25 y.o. female.   Abscess  Patient presents to the ED for evaluation of a palpable breast abscess.  Patient states she has had abscesses in the past.  She has never had to have a drainage procedure performed on her breast but other areas she has had an I&D procedure.  Patient noticed an area of swelling and tenderness in her lower breast area on the right side couple days ago.  It has become progressively more swollen and more tender.  Today it was more severe.  She denies any fevers or chills.  No vomiting or diarrhea  Past Medical History:  Diagnosis Date   Abscess    Hypertension    pt reports in the past, but not currently on medicine for   Mental disorder    anxiety and depression    Patient Active Problem List   Diagnosis Date Noted   Obesity (BMI 30-39.9) 06/12/2019   Anxiety 06/12/2019   Hypertension 04/16/2018    History reviewed. No pertinent surgical history.   OB History   No obstetric history on file.     Family History  Problem Relation Age of Onset   Hyperlipidemia Mother    Hypertension Mother    Diabetes Father    Anxiety disorder Sister    Depression Sister    Pancreatitis Sister    Diabetes Maternal Grandfather    Breast cancer Paternal Grandmother    Diabetes Paternal Grandfather    Heart disease Paternal Grandfather    Stroke Paternal Grandfather    Pancreatitis Brother     Social History   Tobacco Use   Smoking status: Former    Types: E-cigarettes   Smokeless tobacco: Never   Tobacco comments:    pt reports cigarrette or vape use occassionally  Vaping Use   Vaping Use: Never used  Substance Use Topics   Alcohol use: Yes    Comment: mixed drink once a week or every 2 weeks   Drug use: Not Currently    Home Medications Prior to  Admission medications   Medication Sig Start Date End Date Taking? Authorizing Provider  amoxicillin-clavulanate (AUGMENTIN) 875-125 MG tablet Take 1 tablet by mouth 2 (two) times daily for 7 days. 03/24/21 03/31/21 Yes Linwood Dibbles, MD  naproxen (NAPROSYN) 375 MG tablet Take 1 tablet (375 mg total) by mouth 2 (two) times daily. 03/24/21  Yes Linwood Dibbles, MD  venlafaxine XR (EFFEXOR-XR) 37.5 MG 24 hr capsule Take 1 capsule by mouth daily. 10/19/20  Yes [provider]  ibuprofen (ADVIL) 600 MG tablet Take 600 mg by mouth every 6 (six) hours as needed. Pt reports taking as needed    [provider]    Allergies    Iodides and Shellfish allergy  Review of Systems   Review of Systems  All other systems reviewed and are negative.  Physical Exam Updated Vital Signs BP (!) 140/94   Pulse 70   Temp (!) 97.5 F (36.4 C) (Oral)   Resp 18   Ht 1.651 m (5\' 5" )   Wt 97.5 kg   SpO2 100%   BMI 35.78 kg/m   Physical Exam Vitals and nursing note reviewed.  Constitutional:      General: She is not in acute distress.    Appearance: She is  well-developed.  HENT:     Head: Normocephalic and atraumatic.     Right Ear: External ear normal.     Left Ear: External ear normal.  Eyes:     General: No scleral icterus.       Right eye: No discharge.        Left eye: No discharge.     Conjunctiva/sclera: Conjunctivae normal.  Neck:     Trachea: No tracheal deviation.  Cardiovascular:     Rate and Rhythm: Normal rate.  Pulmonary:     Effort: Pulmonary effort is normal. No respiratory distress.     Breath sounds: No stridor.  Chest:  Breasts:    Right: Tenderness present.     Comments: Inferior aspect of the right breast adjacent to the inframammary crease there is an area of erythema and tenderness, induration and fluctuance noted Abdominal:     General: There is no distension.  Musculoskeletal:        General: No swelling or deformity.     Cervical back: Neck supple.   Skin:    General: Skin is warm and dry.     Findings: No rash.  Neurological:     Mental Status: She is alert.     Cranial Nerves: Cranial nerve deficit: no gross deficits.    ED Results / Procedures / Treatments   Labs (all labs ordered are listed, but only abnormal results are displayed) Labs Reviewed  BODY FLUID CULTURE W GRAM STAIN    EKG None  Radiology No results found.  Procedures .Marland KitchenIncision and Drainage  Date/Time: 03/24/2021 9:25 AM Performed by: Linwood Dibbles, MD Authorized by: Linwood Dibbles, MD   Consent:    Consent obtained:  Verbal   Consent given by:  Patient   Risks discussed:  Bleeding, incomplete drainage, pain and damage to other organs   Alternatives discussed:  No treatment Universal protocol:    Procedure explained and questions answered to patient or proxy's satisfaction: yes     Relevant documents present and verified: yes     Test results available : yes     Imaging studies available: yes     Required blood products, implants, devices, and special equipment available: yes     Site/side marked: yes     Immediately prior to procedure, a time out was called: yes     Patient identity confirmed:  Verbally with patient Location:    Type:  Abscess   Location: Right breast. Pre-procedure details:    Skin preparation:  Chlorhexidine Sedation:    Sedation type:  None Anesthesia:    Anesthesia method:  Local infiltration   Local anesthetic:  Lidocaine 2% WITH epi Procedure type:    Complexity:  Simple Procedure details:    Ultrasound guidance: yes     Needle aspiration: yes     Drainage:  Purulent   Drainage amount:  Moderate (3 cc of purulent material)   Packing materials:  1/4 in gauze Post-procedure details:    Procedure completion:  Tolerated well, no immediate complications   Medications Ordered in ED Medications  lidocaine-EPINEPHrine (XYLOCAINE W/EPI) 2 %-1:200000 (PF) injection 10 mL (10 mLs Infiltration Given by Other 03/24/21 0848)   HYDROcodone-acetaminophen (NORCO/VICODIN) 5-325 MG per tablet 1 tablet (1 tablet Oral Given 03/24/21 0848)    ED Course  I have reviewed the triage vital signs and the nursing notes.  Pertinent labs & imaging results that were available during my care of the patient were reviewed by me and considered in  my medical decision making (see chart for details).    MDM Rules/Calculators/A&P                           Patient has evidence of breast abscess with some surrounding cellulitis.  No history of MRSA.  Needle aspiration performed and significant amount of pus aspirated.  Patient felt significantly better.  Discussed possible need for repeat needle aspiration if the swelling recurs.  Patient will follow up with a primary care doctor.  Recommend return to the ED for fevers chills or other concerning symptoms.  Will prescribe course of antibiotics.  Wound culture was also sent off for analysis Final Clinical Impression(s) / ED Diagnoses Final diagnoses:  Breast abscess    Rx / DC Orders ED Discharge Orders          Ordered    amoxicillin-clavulanate (AUGMENTIN) 875-125 MG tablet  2 times daily        03/24/21 0924    naproxen (NAPROSYN) 375 MG tablet  2 times daily        03/24/21 7106             Linwood Dibbles, MD 03/24/21 [phone]

## 2021-03-24 NOTE — ED Triage Notes (Signed)
Pt presents with an abscess x 2 days under the right breast with a hx of the same. Pt has been referred to derm but unable to make an appointment.

## 2021-03-24 NOTE — Discharge Instructions (Addendum)
Apply warm compresses to the area take the antibiotics as prescribed.  Follow-up with your doctor to make sure the infection and abscess resolved.  As we discussed, if you have recurrent swelling you may need repeat needle aspiration.  Return to the hospital if you start having high fevers, worsening symptoms

## 2021-03-30 LAB — AEROBIC CULTURE W GRAM STAIN (SUPERFICIAL SPECIMEN): Gram Stain: NONE SEEN

## 2021-05-07 ENCOUNTER — Other Ambulatory Visit: Payer: Self-pay

## 2021-05-07 ENCOUNTER — Emergency Department (HOSPITAL_BASED_OUTPATIENT_CLINIC_OR_DEPARTMENT_OTHER): Payer: BLUE CROSS/BLUE SHIELD | Admitting: Radiology

## 2021-05-07 ENCOUNTER — Emergency Department (HOSPITAL_BASED_OUTPATIENT_CLINIC_OR_DEPARTMENT_OTHER)
Admission: EM | Admit: 2021-05-07 | Discharge: 2021-05-07 | Disposition: A | Discharge status: Home / Self Care | Payer: BLUE CROSS/BLUE SHIELD | Attending: Student | Admitting: Student

## 2021-05-07 ENCOUNTER — Encounter (HOSPITAL_BASED_OUTPATIENT_CLINIC_OR_DEPARTMENT_OTHER): Payer: Self-pay | Admitting: Emergency Medicine

## 2021-05-07 DIAGNOSIS — I10 Essential (primary) hypertension: Secondary | ICD-10-CM | POA: Insufficient documentation

## 2021-05-07 DIAGNOSIS — Z20822 Contact with and (suspected) exposure to covid-19: Secondary | ICD-10-CM | POA: Diagnosis not present

## 2021-05-07 DIAGNOSIS — R519 Headache, unspecified: Secondary | ICD-10-CM | POA: Diagnosis not present

## 2021-05-07 DIAGNOSIS — R030 Elevated blood-pressure reading, without diagnosis of hypertension: Secondary | ICD-10-CM

## 2021-05-07 DIAGNOSIS — J209 Acute bronchitis, unspecified: Secondary | ICD-10-CM

## 2021-05-07 DIAGNOSIS — Z87891 Personal history of nicotine dependence: Secondary | ICD-10-CM | POA: Diagnosis not present

## 2021-05-07 DIAGNOSIS — J039 Acute tonsillitis, unspecified: Secondary | ICD-10-CM | POA: Diagnosis not present

## 2021-05-07 DIAGNOSIS — Z79899 Other long term (current) drug therapy: Secondary | ICD-10-CM | POA: Diagnosis not present

## 2021-05-07 DIAGNOSIS — J029 Acute pharyngitis, unspecified: Secondary | ICD-10-CM | POA: Diagnosis present

## 2021-05-07 LAB — GROUP A STREP BY PCR: Group A Strep by PCR: NOT DETECTED

## 2021-05-07 LAB — RESP PANEL BY RT-PCR (FLU A&B, COVID) ARPGX2
Influenza A by PCR: NEGATIVE
Influenza B by PCR: NEGATIVE
SARS Coronavirus 2 by RT PCR: NEGATIVE

## 2021-05-07 MED ORDER — LIDOCAINE VISCOUS HCL 2 % MT SOLN
15.0000 mL | Freq: Once | OROMUCOSAL | Status: AC
  Administered 2021-05-07: 14:00:00 15 mL via OROMUCOSAL
  Filled 2021-05-07: qty 15

## 2021-05-07 MED ORDER — LIDOCAINE VISCOUS HCL 2 % MT SOLN: 15.0000 mL | OROMUCOSAL | 0 refills | Status: AC | PRN

## 2021-05-07 MED ORDER — BENZONATATE 100 MG PO CAPS: 100.0000 mg | ORAL_CAPSULE | Freq: Three times a day (TID) | ORAL | 0 refills | Status: AC

## 2021-05-07 MED ORDER — DEXAMETHASONE SODIUM PHOSPHATE 10 MG/ML IJ SOLN
12.0000 mg | Freq: Once | INTRAMUSCULAR | Status: AC
  Administered 2021-05-07: 15:00:00 12 mg via INTRAMUSCULAR
  Filled 2021-05-07: qty 2

## 2021-05-07 NOTE — ED Triage Notes (Signed)
Pt reports sore throat since last night, worse this morning.

## 2021-05-07 NOTE — ED Provider Notes (Signed)
[facility] EMERGENCY DEPT Provider Note   CSN: [id number] Arrival date & time: 05/07/21  1144     History Chief Complaint  Patient presents with   Sore Throat    Virginia Orozco is a 25 y.o. female significant past medical history who presents with ongoing cough, sore throat, malaise, headache, with intermittent nausea, vomiting for the last 3 and half weeks.  Patient reports that she had what felt like the fluid intense fever, gradual improvement of symptoms, and now recurrence for the last 2 to 3 days.  Patient has been taking Alka-Seltzer, ibuprofen, Aleve with minimal relief.  Patient reports that she is having difficulty eating or drinking secondary to pain in throat.  Patient reports that she is still able to swallow both solids and liquids without a feeling of getting stuck.  Patient has not taken anything for pain or fever today.  Patient rates her throat pain as 8/10, reports that nothing makes it better.  Patient reports that her cough is productive, wet in nature. Patient denies chest pain, shortness of breath.  Patient reports her headache is all over, started out gradually, is not associated with vision changes, photophobia, phonophobia, weakness, numbness.   Sore Throat Associated symptoms include headaches. Pertinent negatives include no chest pain and no shortness of breath.      Past Medical History:  Diagnosis Date   Abscess    Hypertension    pt reports in the past, but not currently on medicine for   Mental disorder    anxiety and depression    Patient Active Problem List   Diagnosis Date Noted   Obesity (BMI 30-39.9) 06/12/2019   Anxiety 06/12/2019   Hypertension 04/16/2018    History reviewed. No pertinent surgical history.   OB History   No obstetric history on file.     Family History  Problem Relation Age of Onset   Hyperlipidemia Mother    Hypertension Mother    Diabetes Father    Anxiety disorder Sister    Depression Sister     Pancreatitis Sister    Diabetes Maternal Grandfather    Breast cancer Paternal Grandmother    Diabetes Paternal Grandfather    Heart disease Paternal Grandfather    Stroke Paternal Grandfather    Pancreatitis Brother     Social History   Tobacco Use   Smoking status: Former    Types: E-cigarettes   Smokeless tobacco: Never   Tobacco comments:    pt reports cigarrette or vape use occassionally  Vaping Use   Vaping Use: Never used  Substance Use Topics   Alcohol use: Yes    Comment: mixed drink once a week or every 2 weeks   Drug use: Not Currently    Home Medications Prior to Admission medications   Medication Sig Start Date End Date Taking? Authorizing Provider  benzonatate (TESSALON) 100 MG capsule Take 1 capsule (100 mg total) by mouth every 8 (eight) hours. 05/07/21  Yes Nigel Ericsson H, PA-C  lidocaine (XYLOCAINE) 2 % solution Use as directed 15 mLs in the mouth or throat every 4 (four) hours as needed for mouth pain. 05/07/21  Yes Sharyl Panchal H, PA-C  ibuprofen (ADVIL) 600 MG tablet Take 600 mg by mouth every 6 (six) hours as needed. Pt reports taking as needed    [provider]  naproxen (NAPROSYN) 375 MG tablet Take 1 tablet (375 mg total) by mouth 2 (two) times daily. 03/24/21   Linwood Dibbles, MD  venlafaxine XR [facility])  37.5 MG 24 hr capsule Take 1 capsule by mouth daily. 10/19/20   [provider]    Allergies    Iodides and Shellfish allergy  Review of Systems   Review of Systems  HENT:  Positive for sore throat.   Respiratory:  Positive for cough. Negative for shortness of breath.   Cardiovascular:  Negative for chest pain.  Neurological:  Positive for headaches.  All other systems reviewed and are negative.  Physical Exam Updated Vital Signs BP (!) 148/95 (BP Location: Right Arm)   Pulse 78   Temp 97.9 F (36.6 C) (Oral)   Resp 16   LMP 04/25/2021 (Approximate)   SpO2 97%   Physical Exam Vitals and nursing note  reviewed.  Constitutional:      General: She is not in acute distress.    Appearance: Normal appearance.  HENT:     Head: Normocephalic and atraumatic.     Right Ear: Tympanic membrane and ear canal normal.     Left Ear: Tympanic membrane and ear canal normal.     Mouth/Throat:     Comments: Posterior oropharynx clear with minimal redness.  Tonsils are 1-2+ bilaterally, without any evidence of exudate.  Uvula is midline.  No evidence of peritonsillar abscess.  There is no floor of mouth tenderness to suggest Ludwig angina. Eyes:     General:        Right eye: No discharge.        Left eye: No discharge.  Cardiovascular:     Rate and Rhythm: Normal rate and regular rhythm.  Pulmonary:     Effort: Pulmonary effort is normal. No respiratory distress.  Musculoskeletal:        General: No deformity.     Cervical back: Normal range of motion and neck supple. No tenderness.  Lymphadenopathy:     Cervical: No cervical adenopathy.  Skin:    General: Skin is warm and dry.     Capillary Refill: Capillary refill takes less than 2 seconds.  Neurological:     Mental Status: She is alert and oriented to person, place, and time.     Comments: CN III through XII grossly intact.  Intact strength 5 out of 5 bilateral upper and lower extremities.  A and O x3.  Romberg negative, gait normal.  Psychiatric:        Mood and Affect: Mood normal.        Behavior: Behavior normal.    ED Results / Procedures / Treatments   Labs (all labs ordered are listed, but only abnormal results are displayed) Labs Reviewed  GROUP A STREP BY PCR  RESP PANEL BY RT-PCR (FLU A&B, COVID) [id number]    EKG None  Radiology DG Chest 2 View  Result Date: 05/07/2021 CLINICAL DATA:  Cough and sore throat. EXAM: CHEST - 2 VIEW COMPARISON:  February 17, 2018 FINDINGS: The heart size and mediastinal contours are within normal limits. Both lungs are clear. The visualized skeletal structures are unremarkable. IMPRESSION: No  active cardiopulmonary disease. Electronically Signed   By: Ted Mcalpine M.D.   On: 05/07/2021 14:54    Procedures Procedures   Medications Ordered in ED Medications  dexamethasone (DECADRON) injection 12 mg (has no administration in time range)  lidocaine (XYLOCAINE) 2 % viscous mouth solution 15 mL (15 mLs Mouth/Throat Given 05/07/21 1339)    ED Course  I have reviewed the triage vital signs and the nursing notes.  Pertinent labs & imaging results that were available during  my care of the patient were reviewed by me and considered in my medical decision making (see chart for details).    MDM Rules/Calculators/A&P                         Overall well-appearing patient with 3-1/2 weeks of upper respiratory symptoms.  Will obtain COVID, flu swab at this time as well as chest x-ray to evaluate for potential infiltrate.  Patient is in no respiratory distress, no accessory breath sounds noted.  Patient's major complaint is her sore throat at this time, however she can tolerate swallowing without difficulty.   No significant oropharyngeal disease noted, tonsils are symmetric, moderately swollen.  Uvula is midline.  Given 3-1/2 weeks of productive cough, as well as significant pain with swollen tonsils, sore throat we will administer intramuscular Decadron injection to treat for presumptive acute bronchitis, as well as tonsillitis in the context of negative RVP, strep throat, normal chest x-ray.  Patient encouraged to drink plenty of fluids, take Tylenol, ibuprofen as needed for pain, body aches.  Patient encouraged to rest.  Patient with incidental elevated blood pressure of 165/1 11x1 today.  Patient denies history of hypertension.  Encourage follow-up with primary care doctor for further evaluation of elevated blood pressure.  Patient without any focal neurologic deficits, chest pain.  She does have headache that is consistent with tension, dehydration headache in context of her ongoing URI  symptoms.  RVP negative for COVID, flu; throat swab negative for streptococcal pharyngitis.  Given somewhat swollen tonsils without evidence of peritonsillar abscess, ongoing cough for 3-1/2 weeks we will administer Decadron intramuscular injection today.  In addition we will provide symptomatic prescription for Tessalon, viscous lidocaine for sore throat.  Encourage patient to follow-up if her symptoms worsen or fail to improve.  Patient understands and agrees to plan, discharged in stable condition at this time. Final Clinical Impression(s) / ED Diagnoses Final diagnoses:  Tonsillitis  Acute bronchitis, unspecified organism  Single episode of elevated blood pressure    Rx / DC Orders ED Discharge Orders          Ordered    benzonatate (TESSALON) 100 MG capsule  Every 8 hours        05/07/21 1502    lidocaine (XYLOCAINE) 2 % solution  Every 4 hours PRN        05/07/21 1502             Karoline Fleer, [place] H, PA-C 05/07/21 1512    Glendora Score, MD 05/07/21 1546

## 2021-05-07 NOTE — Discharge Instructions (Addendum)
Please use Tylenol or ibuprofen for pain.  You may use 600 mg ibuprofen every 6 hours or 1000 mg of Tylenol every 6 hours.  You may choose to alternate between the 2.  This would be most effective.  Not to exceed 4 g of Tylenol within 24 hours.  Not to exceed 3200 mg ibuprofen 24 hours.  You can use the tessalon perles, viscous lidocaine as needed for your sore throat.  Recommend following up with your primary care doctor to discuss elevated blood pressure if you continue to have elevated readings.

## 2021-05-10 ENCOUNTER — Other Ambulatory Visit: Payer: Self-pay

## 2021-05-10 ENCOUNTER — Ambulatory Visit
Admission: EM | Admit: 2021-05-10 | Discharge: 2021-05-10 | Disposition: A | Discharge status: Home / Self Care | Payer: BLUE CROSS/BLUE SHIELD | Attending: Emergency Medicine | Admitting: Emergency Medicine

## 2021-05-10 DIAGNOSIS — B309 Viral conjunctivitis, unspecified: Secondary | ICD-10-CM | POA: Diagnosis not present

## 2021-05-10 DIAGNOSIS — J208 Acute bronchitis due to other specified organisms: Secondary | ICD-10-CM | POA: Diagnosis not present

## 2021-05-10 DIAGNOSIS — J069 Acute upper respiratory infection, unspecified: Secondary | ICD-10-CM | POA: Diagnosis not present

## 2021-05-10 DIAGNOSIS — J3489 Other specified disorders of nose and nasal sinuses: Secondary | ICD-10-CM | POA: Diagnosis not present

## 2021-05-10 MED ORDER — OLOPATADINE HCL 0.1 % OP SOLN: 1.0000 [drp] | Freq: Two times a day (BID) | OPHTHALMIC | 0 refills | Status: AC

## 2021-05-10 MED ORDER — GUAIFENESIN 400 MG PO TABS: ORAL_TABLET | ORAL | 0 refills | Status: AC

## 2021-05-10 MED ORDER — METHYLPREDNISOLONE SODIUM SUCC 125 MG IJ SOLR
125.0000 mg | Freq: Once | INTRAMUSCULAR | Status: AC
  Administered 2021-05-10: 125 mg via INTRAMUSCULAR

## 2021-05-10 MED ORDER — IBUPROFEN 400 MG PO TABS: 400.0000 mg | ORAL_TABLET | Freq: Four times a day (QID) | ORAL | 0 refills | Status: AC | PRN

## 2021-05-10 NOTE — ED Provider Notes (Signed)
[facility]    CSN: [id number] Arrival date & time: 05/10/21  1110    HISTORY  No chief complaint on file.  HPI Virginia Orozco is a 25 y.o. female. Patient reports 3-week history of upper respiratory illness that began with influenza, states she went to the emergency room a few days ago, all viral testing is negative at the time.  Patient states she was given a steroid injection and Tessalon Perles along with some lidocaine solution to ease her pain in her throat.  Patient states cough suppressant is worthless and the steroid injection wore off after a few hours.  Patient states she feels like she still has a significant amount of congestion in her chest and she is to still working hard to get it up and out.  Patient denies fever, aches, chills, nausea, vomiting, diarrhea.  Blood pressure is elevated on arrival today.  Patient also complains of redness in her left eye, states she woke this morning with some crusting, states the eye is also been a little bit itchy but the redness that she woke up with is better at this time.  She denies history of asthma, allergies.  The history is provided by the patient.  Past Medical History:  Diagnosis Date   Abscess    Hypertension    pt reports in the past, but not currently on medicine for   Mental disorder    anxiety and depression   Patient Active Problem List   Diagnosis Date Noted   Obesity (BMI 30-39.9) 06/12/2019   Anxiety 06/12/2019   Hypertension 04/16/2018   History reviewed. No pertinent surgical history. OB History   No obstetric history on file.    Home Medications    Prior to Admission medications   Medication Sig Start Date End Date Taking? Authorizing Provider  guaifenesin (HUMIBID E) 400 MG TABS tablet Take 1 tablet 3 times daily as needed for chest congestion and cough 05/10/21  Yes Theadora Rama Scales, PA-C  ibuprofen (ADVIL) 400 MG tablet Take 1 tablet (400 mg total) by mouth every 6 (six) hours as needed.  05/10/21  Yes Theadora Rama Scales, PA-C  olopatadine (PATADAY) 0.1 % ophthalmic solution Place 1 drop into both eyes 2 (two) times daily. 05/10/21  Yes Theadora Rama Scales, PA-C  benzonatate (TESSALON) 100 MG capsule Take 1 capsule (100 mg total) by mouth every 8 (eight) hours. 05/07/21   Prosperi, Christian H, PA-C  lidocaine (XYLOCAINE) 2 % solution Use as directed 15 mLs in the mouth or throat every 4 (four) hours as needed for mouth pain. 05/07/21   Prosperi, Christian H, PA-C  naproxen (NAPROSYN) 375 MG tablet Take 1 tablet (375 mg total) by mouth 2 (two) times daily. 03/24/21   Linwood Dibbles, MD  venlafaxine XR (EFFEXOR-XR) 37.5 MG 24 hr capsule Take 1 capsule by mouth daily. 10/19/20   [provider]   Family History Family History  Problem Relation Age of Onset   Hyperlipidemia Mother    Hypertension Mother    Diabetes Father    Anxiety disorder Sister    Depression Sister    Pancreatitis Sister    Diabetes Maternal Grandfather    Breast cancer Paternal Grandmother    Diabetes Paternal Grandfather    Heart disease Paternal Grandfather    Stroke Paternal Grandfather    Pancreatitis Brother    Social History Social History   Tobacco Use   Smoking status: Former    Types: E-cigarettes   Smokeless tobacco: Never  Tobacco comments:    pt reports cigarrette or vape use occassionally  Vaping Use   Vaping Use: Never used  Substance Use Topics   Alcohol use: Yes    Comment: mixed drink once a week or every 2 weeks   Drug use: Not Currently   Allergies   Iodides and Shellfish allergy  Review of Systems Review of Systems Pertinent findings noted in history of present illness.   Physical Exam Triage Vital Signs ED Triage Vitals  Enc Vitals Group     BP 03/31/21 0827 (!) 147/82     Pulse Rate 03/31/21 0827 72     Resp 03/31/21 0827 18     Temp 03/31/21 0827 98.3 F (36.8 C)     Temp Source 03/31/21 0827 Oral     SpO2 03/31/21 0827 98 %     Weight --       Height --      Head Circumference --      Peak Flow --      Pain Score 03/31/21 0826 5     Pain Loc --      Pain Edu? --      Excl. in GC? --   No data found.  Updated Vital Signs BP (!) 157/96 (BP Location: Right Arm)   Pulse 81   Temp 98.1 F (36.7 C) (Oral)   Resp 20   LMP 04/25/2021 (Approximate)   SpO2 97%   Physical Exam Vitals and nursing note reviewed.  Constitutional:      General: She is not in acute distress.    Appearance: Normal appearance. She is not ill-appearing.  HENT:     Head: Normocephalic and atraumatic.     Salivary Glands: Right salivary gland is not diffusely enlarged or tender. Left salivary gland is not diffusely enlarged or tender.     Right Ear: Tympanic membrane, ear canal and external ear normal. No drainage. No middle ear effusion. There is no impacted cerumen. Tympanic membrane is not erythematous or bulging.     Left Ear: Tympanic membrane, ear canal and external ear normal. No drainage.  No middle ear effusion. There is no impacted cerumen. Tympanic membrane is not erythematous or bulging.     Nose: Rhinorrhea present. No nasal deformity, septal deviation, mucosal edema or congestion. Rhinorrhea is clear.     Right Turbinates: Not enlarged, swollen or pale.     Left Turbinates: Not enlarged, swollen or pale.     Right Sinus: No maxillary sinus tenderness or frontal sinus tenderness.     Left Sinus: No maxillary sinus tenderness or frontal sinus tenderness.     Mouth/Throat:     Lips: Pink. No lesions.     Mouth: Mucous membranes are moist. No oral lesions.     Pharynx: Oropharynx is clear. Uvula midline. Posterior oropharyngeal erythema present. No uvula swelling.     Tonsils: No tonsillar exudate. 0 on the right. 0 on the left.  Eyes:     General: Lids are normal.        Right eye: No discharge.        Left eye: No discharge.     Extraocular Movements: Extraocular movements intact.     Conjunctiva/sclera:     Right eye: Right conjunctiva  is injected.     Left eye: Left conjunctiva is not injected.  Neck:     Trachea: Trachea and phonation normal.  Cardiovascular:     Rate and Rhythm: Normal rate and regular rhythm.  Pulses: Normal pulses.     Heart sounds: Normal heart sounds. No murmur heard.   No friction rub. No gallop.  Pulmonary:     Effort: Pulmonary effort is normal. No accessory muscle usage, prolonged expiration or respiratory distress.     Breath sounds: Normal breath sounds. No stridor, decreased air movement or transmitted upper airway sounds. No decreased breath sounds, wheezing, rhonchi or rales.     Comments: Coarse breath sounds at both lung bases, turbulent breath sounds in mid and upper lung fields.  No wheeze, rale, rhonchi appreciated.  Breath sounds easily audible throughout. Chest:     Chest wall: No tenderness.  Musculoskeletal:        General: Normal range of motion.     Cervical back: Normal range of motion and neck supple. Normal range of motion.  Lymphadenopathy:     Cervical: Cervical adenopathy present.     Right cervical: Superficial cervical adenopathy present.     Left cervical: Superficial cervical adenopathy present.  Skin:    General: Skin is warm and dry.     Findings: No erythema or rash.  Neurological:     General: No focal deficit present.     Mental Status: She is alert and oriented to person, place, and time.  Psychiatric:        Mood and Affect: Mood normal.        Behavior: Behavior normal.    Visual Acuity Right Eye Distance:   Left Eye Distance:   Bilateral Distance:    Right Eye Near:   Left Eye Near:    Bilateral Near:     UC Couse / Diagnostics / Procedures:    EKG  Radiology No results found.  Procedures Procedures (including critical care time)  UC Diagnoses / Final Clinical Impressions(s)   I have reviewed the triage vital signs and the nursing notes.  Pertinent labs & imaging results that were available during my care of the patient were  reviewed by me and considered in my medical decision making (see chart for details).   Final diagnoses:  Acute upper respiratory infection  Viral conjunctivitis  Rhinorrhea  Acute bronchitis, viral   Patient provided with a repeat steroid injection as this provided some relief the first time.  I recommend that she follow this up by taking ibuprofen 40 mg 3 times daily, prescription provided.  I also recommend that she take Mucinex 3 times daily until her cough has resolved.  Patient advised to discontinue Tessalon Perles as they have not been effective and suppressing her cough is not recommended.  Patient advised to use Pataday eyedrops to relieve redness and irritation eyes.  ED Prescriptions     Medication Sig Dispense Auth. Provider   ibuprofen (ADVIL) 400 MG tablet Take 1 tablet (400 mg total) by mouth every 6 (six) hours as needed. 30 tablet Theadora Rama Scales, PA-C   olopatadine (PATADAY) 0.1 % ophthalmic solution Place 1 drop into both eyes 2 (two) times daily. 5 mL Theadora Rama Scales, PA-C   guaifenesin (HUMIBID E) 400 MG TABS tablet Take 1 tablet 3 times daily as needed for chest congestion and cough 21 tablet Theadora Rama Scales, PA-C      PDMP not reviewed this encounter.  Pending results:  Labs Reviewed - No data to display  Medications Ordered in UC: Medications  methylPREDNISolone sodium succinate (SOLU-MEDROL) 125 mg/2 mL injection 125 mg (has no administration in time range)    Disposition Upon Discharge:  Condition: stable for  discharge home Home: take medications as prescribed; routine discharge instructions as discussed; follow up as advised.  Patient presented with an acute illness with associated systemic symptoms and significant discomfort requiring urgent management. In my opinion, this is a condition that a prudent lay person (someone who possesses an average knowledge of health and medicine) may potentially expect to result in complications if not  addressed urgently such as respiratory distress, impairment of bodily function or dysfunction of bodily organs.   Routine symptom specific, illness specific and/or disease specific instructions were discussed with the patient and/or caregiver at length.   As such, the patient has been evaluated and assessed, work-up was performed and treatment was provided in alignment with urgent care protocols and evidence based medicine.  Patient/parent/caregiver has been advised that the patient may require follow up for further testing and treatment if the symptoms continue in spite of treatment, as clinically indicated and appropriate.  The patient was tested for COVID-19, Influenza and/or RSV, then the patient/parent/guardian was advised to isolate at home pending the results of his/her diagnostic coronavirus test and potentially longer if they're positive. I have also advised pt that if his/her COVID-19 test returns positive, it's recommended to self-isolate for at least 10 days after symptoms first appeared AND until fever-free for 24 hours without fever reducer AND other symptoms have improved or resolved. Discussed self-isolation recommendations as well as instructions for household member/close contacts as per the [facility] and [place] [facility], and also gave patient the COVID packet with this information.  Patient/parent/caregiver has been advised to return to the [facility] or PCP in 3-5 days if no better; to PCP or the Emergency Department if new signs and symptoms develop, or if the current signs or symptoms continue to change or worsen for further workup, evaluation and treatment as clinically indicated and appropriate  The patient will follow up with their current PCP if and as advised. If the patient does not currently have a PCP we will assist them in obtaining one.   The patient may need specialty follow up if the symptoms continue, in spite of conservative treatment and management, for further workup, evaluation,  consultation and treatment as clinically indicated and appropriate.  Patient/parent/caregiver verbalized understanding and agreement of plan as discussed.  All questions were addressed during visit.  Please see discharge instructions below for further details of plan.  Discharge Instructions:   Discharge Instructions      To calm your upper and lower airways, you received another injection of methylprednisolone in the office today.  Please follow this up by taking ibuprofen 400 mg 3 times daily for the next 3 to 5 days.  Please also continue taking Mucinex, 400 mg 3 times daily to loosen chest congestion and make your cough easier.  Pataday eyedrops did significantly reduce the redness and irritation in your eye, please feel free to use it in both eyes as it is likely that your right eye will soon be red and irritated as well.  Please discontinue Tessalon Perles, particularly since they are no longer effective.  Please follow-up with either your primary care provider or urgent care in the next 3 to 5 days if you have not had significant resolution of your symptoms.  Please follow-up sooner if your symptoms become worse.  I have provided you notes for both work and school if needed.        Theadora Rama Scales, PA-C 05/10/21 1228

## 2021-05-10 NOTE — ED Triage Notes (Signed)
Pt reports having bilateral eye irritation, redness, crusting and itching since yesterday. Patients left eye is reddened at this time.

## 2021-05-10 NOTE — Discharge Instructions (Addendum)
To calm your upper and lower airways, you received another injection of methylprednisolone in the office today.  Please follow this up by taking ibuprofen 400 mg 3 times daily for the next 3 to 5 days.  Please also continue taking Mucinex, 400 mg 3 times daily to loosen chest congestion and make your cough easier.  Pataday eyedrops did significantly reduce the redness and irritation in your eye, please feel free to use it in both eyes as it is likely that your right eye will soon be red and irritated as well.  Please discontinue Tessalon Perles, particularly since they are no longer effective.  Please follow-up with either your primary care provider or urgent care in the next 3 to 5 days if you have not had significant resolution of your symptoms.  Please follow-up sooner if your symptoms become worse.  I have provided you notes for both work and school if needed.

## 2022-01-09 IMAGING — DX DG CHEST 2V
2 series · 2 of 2 positions shown · non-contrast
Comparison: February 17, 2018

CLINICAL DATA: Cough and sore throat.

EXAM:
CHEST - 2 VIEW

[chest pa]
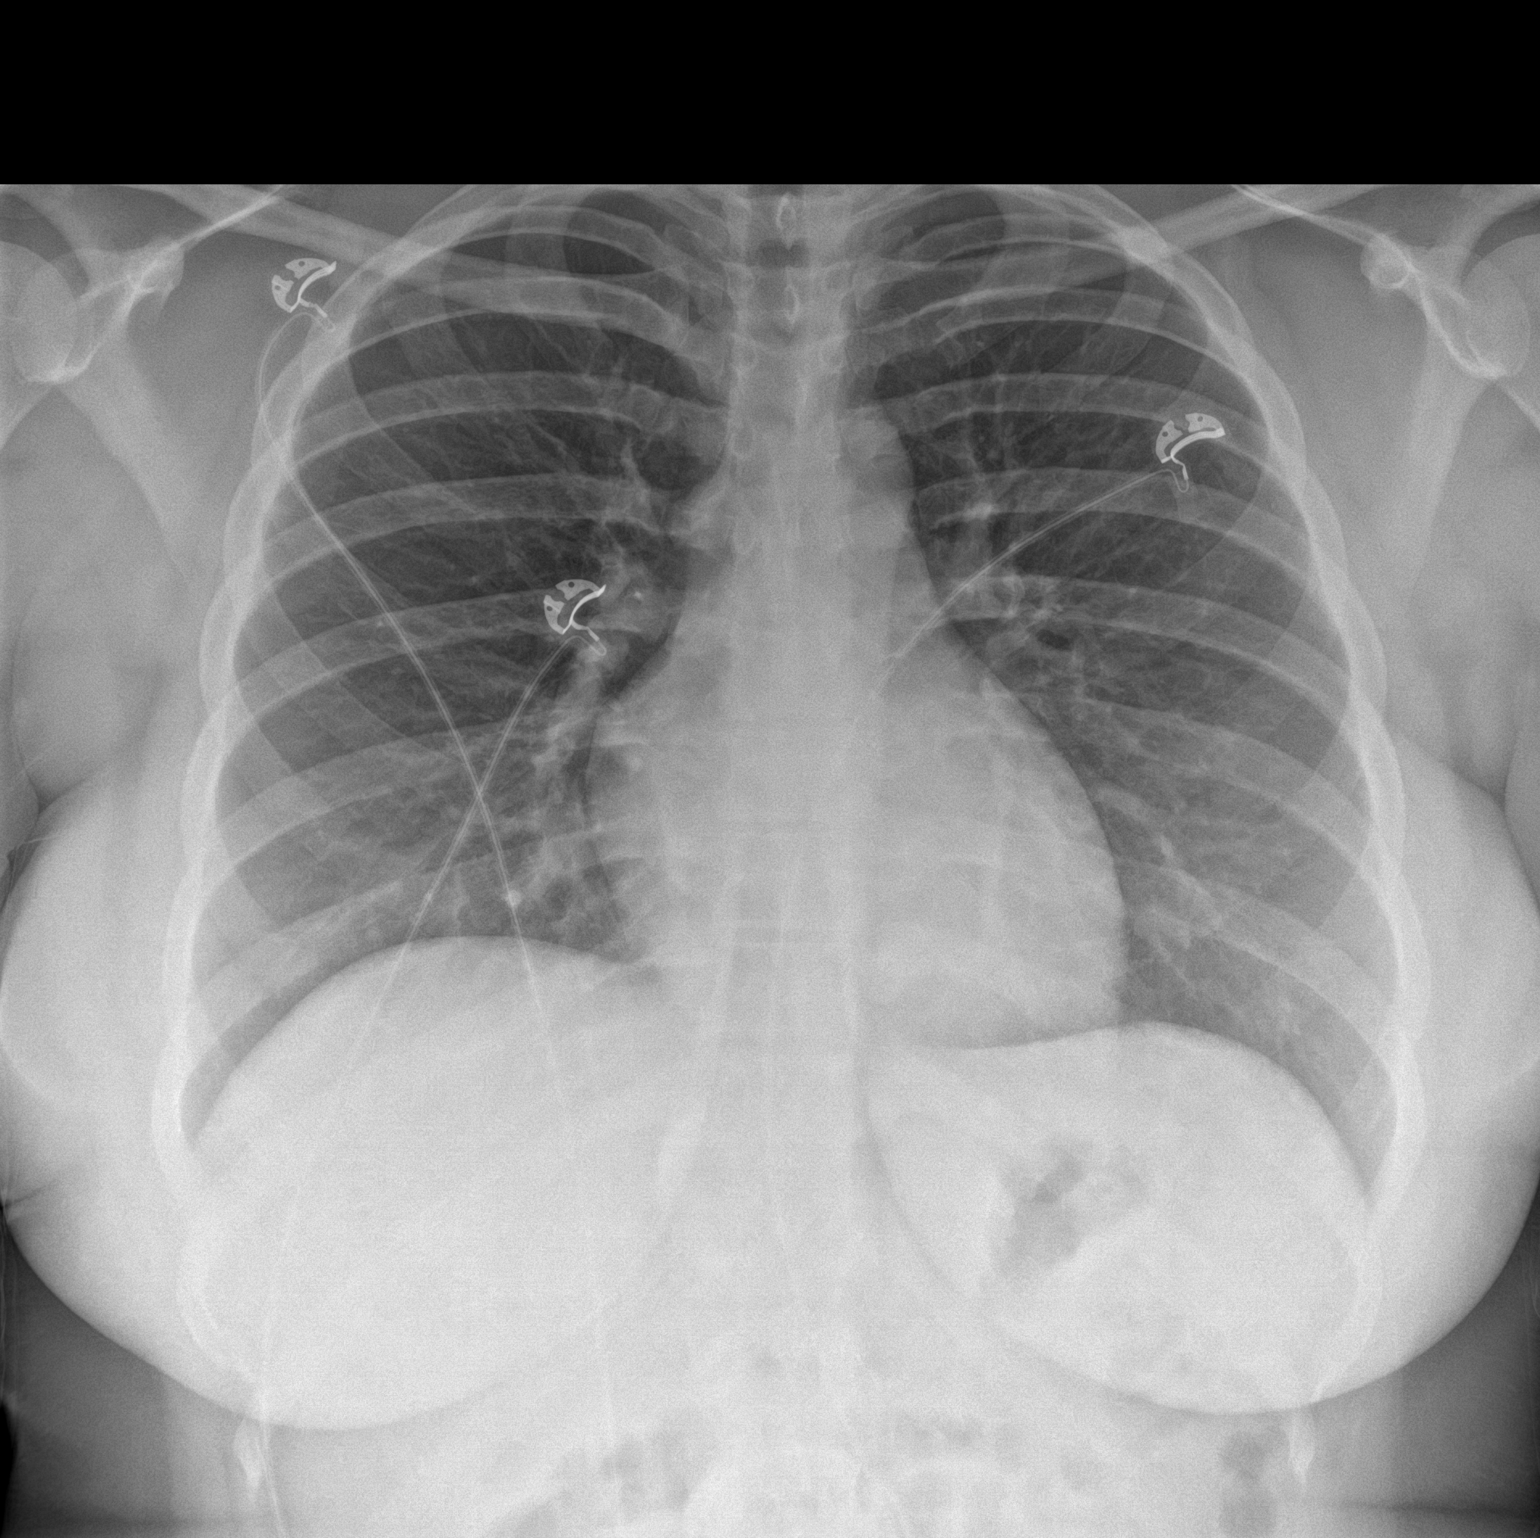

[chest lat]
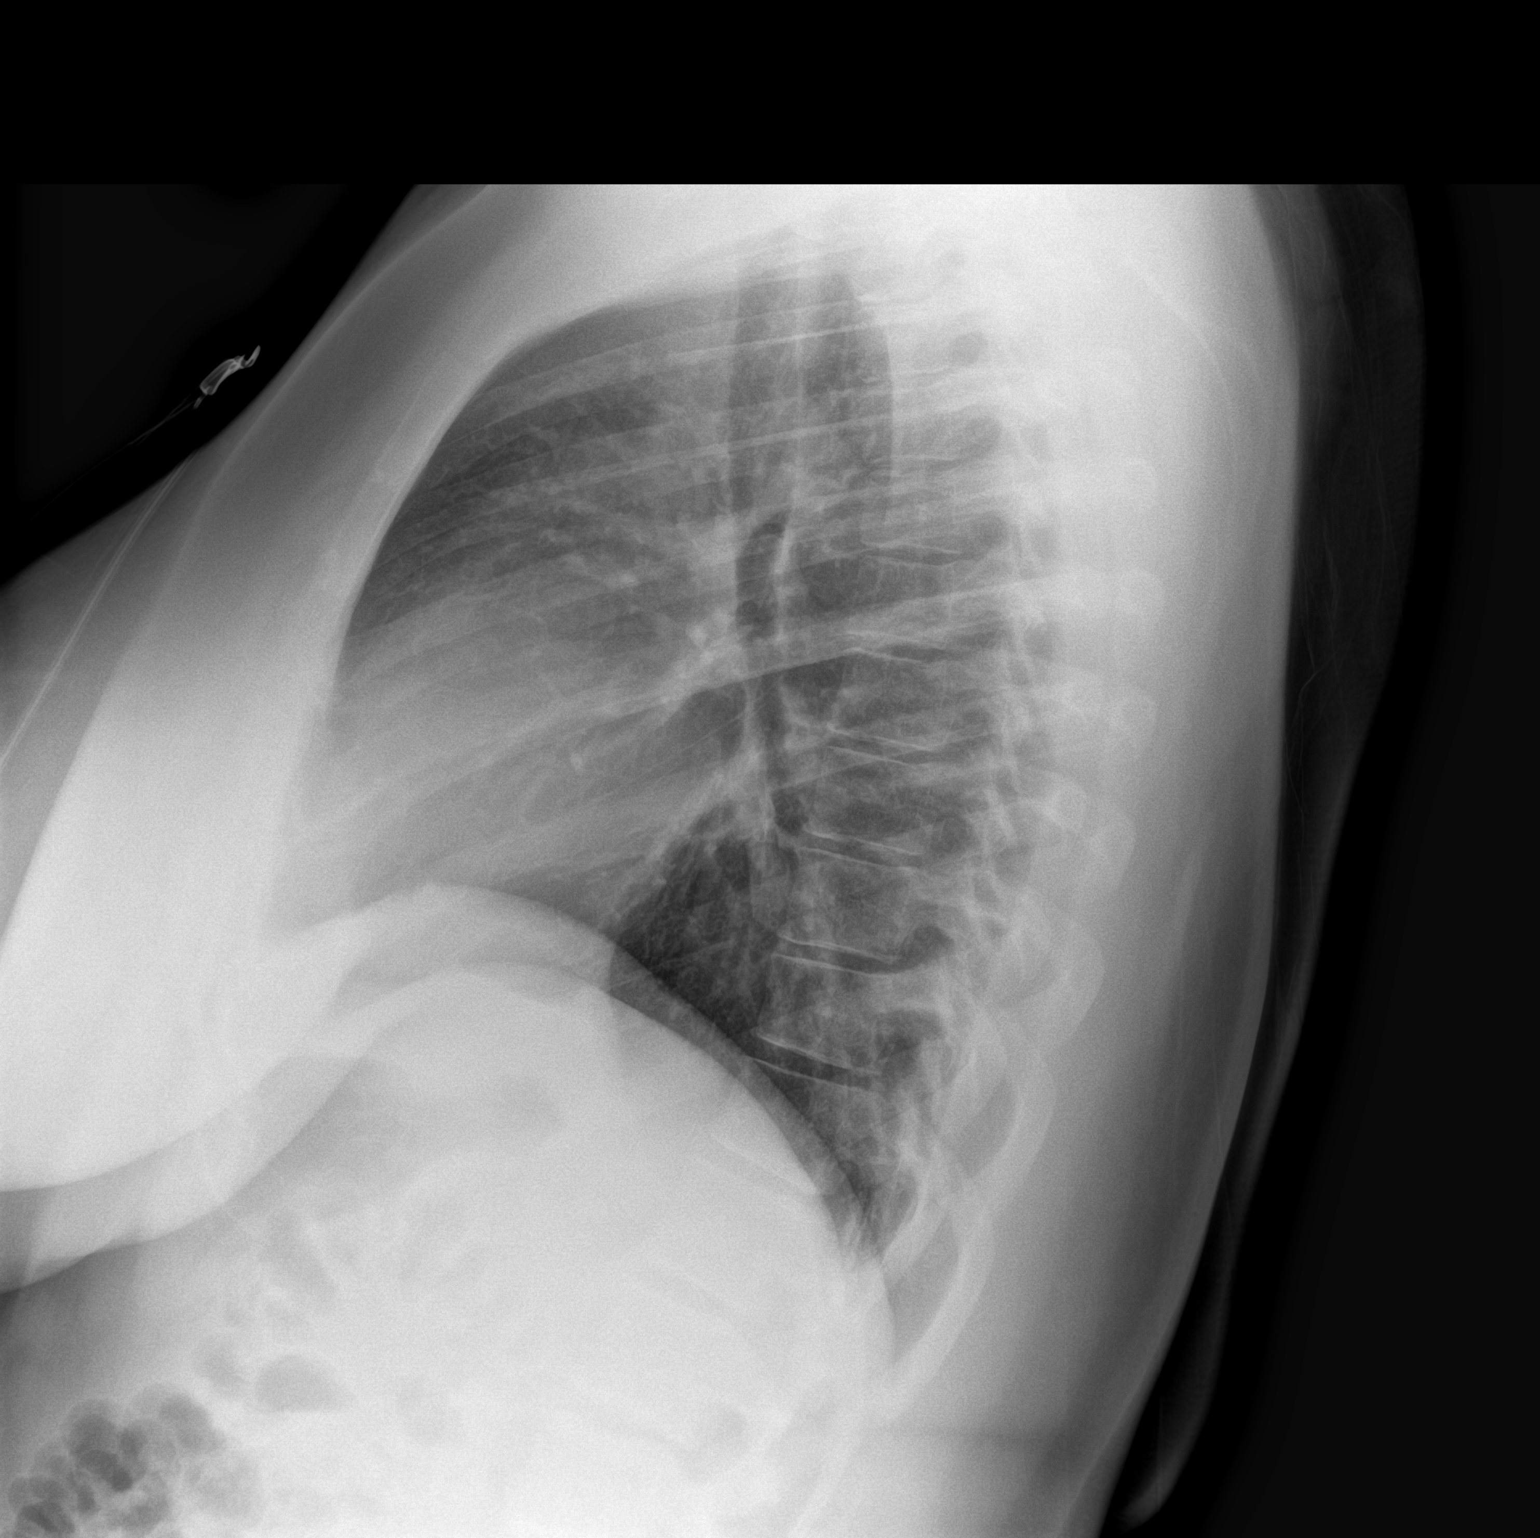

[2 of 2 positions shown; findings below may reference images not displayed]

FINDINGS: The heart size and mediastinal contours are within normal limits.
Both lungs are clear. The visualized skeletal structures are
unremarkable.
IMPRESSION: No active cardiopulmonary disease.

## 2022-03-26 ENCOUNTER — Other Ambulatory Visit: Payer: Self-pay

## 2022-03-26 ENCOUNTER — Emergency Department (HOSPITAL_BASED_OUTPATIENT_CLINIC_OR_DEPARTMENT_OTHER)
Admission: EM | Admit: 2022-03-26 | Discharge: 2022-03-26 | Disposition: A | Discharge status: Home / Self Care | Payer: BLUE CROSS/BLUE SHIELD | Attending: Emergency Medicine | Admitting: Emergency Medicine

## 2022-03-26 ENCOUNTER — Encounter (HOSPITAL_BASED_OUTPATIENT_CLINIC_OR_DEPARTMENT_OTHER): Payer: Self-pay | Admitting: Emergency Medicine

## 2022-03-26 DIAGNOSIS — Y92410 Unspecified street and highway as the place of occurrence of the external cause: Secondary | ICD-10-CM | POA: Diagnosis not present

## 2022-03-26 DIAGNOSIS — Z79899 Other long term (current) drug therapy: Secondary | ICD-10-CM | POA: Diagnosis not present

## 2022-03-26 DIAGNOSIS — M542 Cervicalgia: Secondary | ICD-10-CM | POA: Diagnosis present

## 2022-03-26 DIAGNOSIS — R209 Unspecified disturbances of skin sensation: Secondary | ICD-10-CM | POA: Insufficient documentation

## 2022-03-26 DIAGNOSIS — I10 Essential (primary) hypertension: Secondary | ICD-10-CM | POA: Diagnosis not present

## 2022-03-26 DIAGNOSIS — M546 Pain in thoracic spine: Secondary | ICD-10-CM | POA: Insufficient documentation

## 2022-03-26 MED ORDER — KETOROLAC TROMETHAMINE 15 MG/ML IJ SOLN
15.0000 mg | Freq: Once | INTRAMUSCULAR | Status: AC
  Administered 2022-03-26: 15 mg via INTRAMUSCULAR
  Filled 2022-03-26: qty 1

## 2022-03-26 MED ORDER — METHOCARBAMOL 500 MG PO TABS: 500.0000 mg | ORAL_TABLET | Freq: Two times a day (BID) | ORAL | 0 refills | Status: AC
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
## 2022-03-26 NOTE — ED Notes (Signed)
Patient verbalizes understanding of discharge instructions. Opportunity for questioning and answers were provided. Patient discharged from ED.  °
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
## 2022-04-01 ENCOUNTER — Emergency Department (HOSPITAL_COMMUNITY): Payer: BLUE CROSS/BLUE SHIELD

## 2022-04-01 ENCOUNTER — Encounter (HOSPITAL_COMMUNITY): Payer: Self-pay

## 2022-04-01 ENCOUNTER — Emergency Department (HOSPITAL_COMMUNITY)
Admission: EM | Admit: 2022-04-01 | Discharge: 2022-04-01 | Discharge status: Against Medical Advice | Payer: BLUE CROSS/BLUE SHIELD | Source: Home / Self Care

## 2022-04-01 ENCOUNTER — Other Ambulatory Visit: Payer: Self-pay

## 2022-04-01 ENCOUNTER — Emergency Department (HOSPITAL_BASED_OUTPATIENT_CLINIC_OR_DEPARTMENT_OTHER)
Admission: EM | Admit: 2022-04-01 | Discharge: 2022-04-01 | Disposition: A | Discharge status: Home / Self Care | Payer: BLUE CROSS/BLUE SHIELD | Attending: Emergency Medicine | Admitting: Emergency Medicine

## 2022-04-01 DIAGNOSIS — S0081XA Abrasion of other part of head, initial encounter: Secondary | ICD-10-CM | POA: Diagnosis not present

## 2022-04-01 DIAGNOSIS — M542 Cervicalgia: Secondary | ICD-10-CM | POA: Insufficient documentation

## 2022-04-01 DIAGNOSIS — S0993XA Unspecified injury of face, initial encounter: Secondary | ICD-10-CM | POA: Diagnosis present

## 2022-04-01 DIAGNOSIS — S0012XA Contusion of left eyelid and periocular area, initial encounter: Secondary | ICD-10-CM | POA: Insufficient documentation

## 2022-04-01 DIAGNOSIS — Z5321 Procedure and treatment not carried out due to patient leaving prior to being seen by health care provider: Secondary | ICD-10-CM | POA: Insufficient documentation

## 2022-04-01 DIAGNOSIS — R519 Headache, unspecified: Secondary | ICD-10-CM | POA: Insufficient documentation

## 2022-04-01 NOTE — ED Notes (Signed)
Pt dc home with friend. Opportunities given for questions. Pt verbalized understanding. Leanne Chang, RN

## 2022-04-01 NOTE — Discharge Instructions (Signed)
You will hurt worse tomorrow this is normal.  Please return to the emergency department for change to your vision.  Take 4 over the counter ibuprofen tablets 3 times a day or 2 over-the-counter naproxen tablets twice a day for pain. Also take tylenol 1000mg (2 extra strength) four times a day.
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
## 2022-04-02 ENCOUNTER — Encounter (HOSPITAL_BASED_OUTPATIENT_CLINIC_OR_DEPARTMENT_OTHER): Payer: Self-pay | Admitting: Emergency Medicine

## 2022-05-09 NOTE — Discharge Instructions (Signed)
[facility]  POST OPERATIVE INSTRUCTIONS FOR DR. FOWLER AND [facility] PODIATRY DEPARTMENT   Take your medication as prescribed.  Pain medication should be taken only as needed.  Keep the dressing clean, dry and intact.  Keep your foot elevated above the heart level for the first 48 hours.  Walking to the bathroom and brief periods of walking are acceptable, unless we have instructed you to be non-weight bearing.  Always wear your post-op shoe when walking.  Always use your crutches if you are to be non-weight bearing.  Do not take a shower. Baths are permissible as long as the foot is kept out of the water.   Every hour you are awake:  Bend your knee 15 times. Flex foot 15 times Massage calf 15 times  Call [facility] ([phone]) if any of the following problems occur: You develop a temperature or fever. The bandage becomes saturated with blood. Medication does not stop your pain. Injury of the foot occurs. Any symptoms of infection including redness, odor, or red streaks running from wound. 

## 2022-05-10 ENCOUNTER — Encounter: Payer: Self-pay | Admitting: Podiatry

## 2022-05-11 ENCOUNTER — Encounter: Payer: Self-pay | Admitting: Podiatry

## 2022-05-14 ENCOUNTER — Other Ambulatory Visit: Payer: Self-pay | Admitting: Podiatry

## 2022-05-16 ENCOUNTER — Ambulatory Visit: Payer: BLUE CROSS/BLUE SHIELD | Admitting: Anesthesiology

## 2022-05-16 ENCOUNTER — Other Ambulatory Visit: Payer: Self-pay

## 2022-05-16 ENCOUNTER — Ambulatory Visit: Payer: Self-pay

## 2022-05-16 ENCOUNTER — Encounter
Admission: RE | Disposition: A | Discharge status: Home / Self Care | Payer: Self-pay | Source: Home / Self Care | Attending: Podiatry

## 2022-05-16 ENCOUNTER — Ambulatory Visit
Admission: RE | Admit: 2022-05-16 | Discharge: 2022-05-16 | Disposition: A | Discharge status: Home / Self Care | Payer: BLUE CROSS/BLUE SHIELD | Attending: Podiatry | Admitting: Podiatry

## 2022-05-16 ENCOUNTER — Encounter: Payer: Self-pay | Admitting: Podiatry

## 2022-05-16 DIAGNOSIS — I10 Essential (primary) hypertension: Secondary | ICD-10-CM | POA: Insufficient documentation

## 2022-05-16 DIAGNOSIS — M2041 Other hammer toe(s) (acquired), right foot: Secondary | ICD-10-CM | POA: Diagnosis not present

## 2022-05-16 DIAGNOSIS — Z6838 Body mass index (BMI) 38.0-38.9, adult: Secondary | ICD-10-CM | POA: Diagnosis not present

## 2022-05-16 HISTORY — PX: HAMMER TOE SURGERY: SHX385

## 2022-05-16 LAB — POCT PREGNANCY, URINE: Preg Test, Ur: NEGATIVE

## 2022-05-16 SURGERY — CORRECTION, HAMMER TOE
Anesthesia: General | Site: Toe | Laterality: Right

## 2022-05-16 MED ORDER — OXYCODONE-ACETAMINOPHEN 5-325 MG PO TABS: 1.0000 | ORAL_TABLET | Freq: Four times a day (QID) | ORAL | 0 refills | Status: AC | PRN

## 2022-05-16 MED ORDER — MIDAZOLAM HCL 5 MG/5ML IJ SOLN
INTRAMUSCULAR | Status: DC | PRN
  Administered 2022-05-16: 2 mg via INTRAVENOUS

## 2022-05-16 MED ORDER — BUPIVACAINE HCL (PF) 0.5 % IJ SOLN
INTRAMUSCULAR | Status: DC | PRN
  Administered 2022-05-16: 2 mL

## 2022-05-16 MED ORDER — LACTATED RINGERS IV SOLN: INTRAVENOUS | Status: DC

## 2022-05-16 MED ORDER — DEXMEDETOMIDINE HCL IN NACL 80 MCG/20ML IV SOLN
INTRAVENOUS | Status: DC | PRN
  Administered 2022-05-16: 12 ug via BUCCAL

## 2022-05-16 MED ORDER — FENTANYL CITRATE (PF) 100 MCG/2ML IJ SOLN
INTRAMUSCULAR | Status: DC | PRN
  Administered 2022-05-16 (×2): 50 ug via INTRAVENOUS

## 2022-05-16 MED ORDER — ONDANSETRON HCL 4 MG/2ML IJ SOLN
INTRAMUSCULAR | Status: DC | PRN
  Administered 2022-05-16: 4 mg via INTRAVENOUS

## 2022-05-16 MED ORDER — LIDOCAINE HCL (CARDIAC) PF 100 MG/5ML IV SOSY
PREFILLED_SYRINGE | INTRAVENOUS | Status: DC | PRN
  Administered 2022-05-16 (×2): 60 mg via INTRATRACHEAL

## 2022-05-16 MED ORDER — LIDOCAINE HCL (PF) 1 % IJ SOLN
INTRAMUSCULAR | Status: DC | PRN
  Administered 2022-05-16: 2 mL

## 2022-05-16 MED ORDER — PROPOFOL 10 MG/ML IV BOLUS
INTRAVENOUS | Status: DC | PRN
  Administered 2022-05-16: 200 ug/kg/min via INTRAVENOUS

## 2022-05-16 MED ORDER — CEFAZOLIN SODIUM-DEXTROSE 2-4 GM/100ML-% IV SOLN
2.0000 g | INTRAVENOUS | Status: AC
  Administered 2022-05-16: 2 g via INTRAVENOUS

## 2022-05-16 SURGICAL SUPPLY — 44 items
APL SKNCLS STERI-STRIP NONHPOA (GAUZE/BANDAGES/DRESSINGS) ×1
BENZOIN TINCTURE PRP APPL 2/3 (GAUZE/BANDAGES/DRESSINGS) ×1 IMPLANT
BLADE MED AGGRESSIVE (BLADE) IMPLANT
BLADE OSC/SAGITTAL MD 5.5X18 (BLADE) IMPLANT
BLADE SURG 15 STRL LF DISP TIS (BLADE) IMPLANT
BLADE SURG 15 STRL SS (BLADE)
BNDG CMPR 5X4 CHSV STRCH STRL (GAUZE/BANDAGES/DRESSINGS) ×1
BNDG CMPR 75X41 PLY HI ABS (GAUZE/BANDAGES/DRESSINGS) ×1
BNDG COHESIVE 4X5 TAN STRL (GAUZE/BANDAGES/DRESSINGS) ×1 IMPLANT
BNDG COHESIVE 4X5 TAN STRL LF (GAUZE/BANDAGES/DRESSINGS) IMPLANT
BNDG CONFORM 4 STRL LF (GAUZE/BANDAGES/DRESSINGS) ×1 IMPLANT
BNDG ESMARK 4X12 TAN STRL LF (GAUZE/BANDAGES/DRESSINGS) ×1 IMPLANT
BNDG STRETCH 4X75 STRL LF (GAUZE/BANDAGES/DRESSINGS) ×1 IMPLANT
CANISTER SUCT 1200ML W/VALVE (MISCELLANEOUS) ×1 IMPLANT
COVER LIGHT HANDLE UNIVERSAL (MISCELLANEOUS) ×2 IMPLANT
CUFF TOURN SGL QUICK 18X4 (TOURNIQUET CUFF) IMPLANT
DRAPE FLUOR MINI C-ARM 54X84 (DRAPES) ×1 IMPLANT
DURAPREP 26ML APPLICATOR (WOUND CARE) ×1 IMPLANT
ELECT REM PT RETURN 9FT ADLT (ELECTROSURGICAL) ×1
ELECTRODE REM PT RTRN 9FT ADLT (ELECTROSURGICAL) ×1 IMPLANT
GAUZE SPONGE 4X4 12PLY STRL (GAUZE/BANDAGES/DRESSINGS) ×1 IMPLANT
GAUZE XEROFORM 1X8 LF (GAUZE/BANDAGES/DRESSINGS) ×1 IMPLANT
GLOVE SRG 8 PF TXTR STRL LF DI (GLOVE) ×1 IMPLANT
GLOVE SURG ENC MOIS LTX SZ7.5 (GLOVE) ×1 IMPLANT
GLOVE SURG UNDER POLY LF SZ8 (GLOVE) ×1
GOWN STRL REUS W/ TWL LRG LVL3 (GOWN DISPOSABLE) ×2 IMPLANT
GOWN STRL REUS W/TWL LRG LVL3 (GOWN DISPOSABLE) ×2
K-WIRE DBL END TROCAR 6X.045 (WIRE)
K-WIRE DBL END TROCAR 6X.062 (WIRE)
KIT TURNOVER KIT A (KITS) ×1 IMPLANT
KWIRE DBL END TROCAR 6X.045 (WIRE) IMPLANT
KWIRE DBL END TROCAR 6X.062 (WIRE) IMPLANT
NS IRRIG 500ML POUR BTL (IV SOLUTION) ×1 IMPLANT
PACK EXTREMITY ARMC (MISCELLANEOUS) ×1 IMPLANT
PIN BALLS 3/8 F/.045 WIRE (MISCELLANEOUS) ×1 IMPLANT
RASP SM TEAR CROSS CUT (RASP) IMPLANT
STOCKINETTE IMPERVIOUS LG (DRAPES) ×1 IMPLANT
STRIP CLOSURE SKIN 1/4X4 (GAUZE/BANDAGES/DRESSINGS) ×1 IMPLANT
SUT ETHILON 3-0 (SUTURE) IMPLANT
SUT ETHILON 5-0 FS-2 18 BLK (SUTURE) IMPLANT
SUT MNCRL 5-0+ PC-1 (SUTURE) IMPLANT
SUT MONOCRYL 5-0 (SUTURE)
SUT VIC AB 4-0 FS2 27 (SUTURE) IMPLANT
SUT VIC AB 4-0 PS2 18 (SUTURE) IMPLANT

## 2022-05-16 NOTE — Transfer of Care (Signed)
Immediate Anesthesia Transfer of Care Note  Patient: Virginia Orozco  Procedure(s) Performed: HAMMER TOE CORRECTION (Right: Toe)  Patient Location: PACU  Anesthesia Type: General LMA  Level of Consciousness: awake, alert  and patient cooperative  Airway and Oxygen Therapy: Patient Spontanous Breathing and Patient connected to supplemental oxygen  Post-op Assessment: Post-op Vital signs reviewed, Patient's Cardiovascular Status Stable, Respiratory Function Stable, Patent Airway and No signs of Nausea or vomiting  Post-op Vital Signs: Reviewed and stable  Complications: No notable events documented.

## 2022-05-16 NOTE — Anesthesia Preprocedure Evaluation (Signed)
Anesthesia Evaluation  Patient identified by MRN, date of birth, ID band Patient awake    Reviewed: Allergy & Precautions, H&P , NPO status , Patient's Chart, lab work & pertinent test results, reviewed documented beta blocker date and time   Airway Mallampati: II  TM Distance: >3 FB Neck ROM: full    Dental  (+) Teeth Intact   Pulmonary neg pulmonary ROS   Pulmonary exam normal        Cardiovascular Exercise Tolerance: Good hypertension, On Medications negative cardio ROS Normal cardiovascular exam Rate:Normal     Neuro/Psych  PSYCHIATRIC DISORDERS Anxiety     negative neurological ROS     GI/Hepatic negative GI ROS, Neg liver ROS,,,  Endo/Other    Morbid obesity  Renal/GU negative Renal ROS  negative genitourinary   Musculoskeletal   Abdominal   Peds  Hematology negative hematology ROS (+)   Anesthesia Other Findings   Reproductive/Obstetrics negative OB ROS                             Anesthesia Physical Anesthesia Plan  ASA: 3  Anesthesia Plan: General LMA   Post-op Pain Management:    Induction:   PONV Risk Score and Plan: 4 or greater  Airway Management Planned:   Additional Equipment:   Intra-op Plan:   Post-operative Plan:   Informed Consent: I have reviewed the patients History and Physical, chart, labs and discussed the procedure including the risks, benefits and alternatives for the proposed anesthesia with the patient or authorized representative who has indicated his/her understanding and acceptance.       Plan Discussed with: CRNA  Anesthesia Plan Comments:        Anesthesia Quick Evaluation

## 2022-05-16 NOTE — Op Note (Signed)
Operative note   Surgeon:Joseangel Nettleton [employer] forces logistics/support/administrative officer: None    Preop diagnosis: Right fifth toe hammertoe    Postop diagnosis: Same    Procedure: 1.  Arthroplasty right fifth toe 2.  Intraoperative fluoroscopy use without assistance of radiology review    EBL: Minimal    Anesthesia:local and IV sedation.  Local consisted of a one-to-one mixture of 1% lidocaine plain and 0.5% bupivacaine plain.  A total of 4 cc was used    Hemostasis: Ankle tourniquet inflated to 200 mmHg for 14 minutes    Specimen: None    Complications: None    Operative indications:Virginia Orozco is an 26 y.o. that presents today for surgical intervention.  The risks/benefits/alternatives/complications have been discussed and consent has been given.    Procedure:  Patient was brought into the OR and placed on the operating table in thesupine position. After anesthesia was obtained theright lower extremity was prepped and draped in usual sterile fashion.  Attention was directed to the right foot where overlying the PIPJ region of the fourth toes 2 semielliptical incisions were performed from dorsal distal to proximal plantar.  Full-thickness dissection was carried down to the subcutaneous tissue.  The ellipse was then removed from the surgical field.  The extensor tendon was then released.  The head of the proximal phalanx was exposed.  This was then excised with a surgical saw.  The head of proximal phalanx was then removed from the surgical site in toto.  The wound was flushed with copious amounts of irrigation.  The extensor tendon was reapproximated with a 4-0 Vicryl.  The toe was held in ID rotated position and skin closure was performed with a 4-0 nylon.  A bulky sterile dressing was applied.    Patient tolerated the procedure and anesthesia well.  Was transported from the OR to the PACU with all vital signs stable and vascular status intact. To be discharged per routine protocol.  Will follow up in approximately 1  week in the outpatient clinic.

## 2022-05-16 NOTE — H&P (Signed)
HISTORY AND PHYSICAL INTERVAL NOTE:  05/16/2022  11:53 AM  Virginia Orozco  has presented today for surgery, with the diagnosis of M79.674 - Toe pain, right  M20.41 - Hammer toe of right foot.  The various methods of treatment have been discussed with the patient.  No guarantees were given.  After consideration of risks, benefits and other options for treatment, the patient has consented to surgery.  I have reviewed the patients' chart and labs.     A history and physical examination was performed in my office.  The patient was reexamined.  There have been no changes to this history and physical examination.  Virginia Orozco A

## 2022-05-17 ENCOUNTER — Encounter: Payer: Self-pay | Admitting: Podiatry

## 2022-05-20 NOTE — Anesthesia Postprocedure Evaluation (Signed)
Anesthesia Post Note  Patient: Virginia Orozco  Procedure(s) Performed: HAMMER TOE CORRECTION (Right: Toe)  Patient location during evaluation: PACU Anesthesia Type: General Level of consciousness: awake and alert Pain management: pain level controlled Vital Signs Assessment: post-procedure vital signs reviewed and stable Respiratory status: spontaneous breathing, nonlabored ventilation, respiratory function stable and patient connected to nasal cannula oxygen Cardiovascular status: blood pressure returned to baseline and stable Postop Assessment: no apparent nausea or vomiting Anesthetic complications: no   There were no known notable events for this encounter.   Last Vitals:  Vitals:   05/16/22 1255 05/16/22 1300  BP:  131/89  Pulse: 84 80  Resp: 19 16  Temp:    SpO2: 99% 98%    Last Pain:  Vitals:   05/17/22 1406  TempSrc:   PainSc: 6                  Yevette Edwards

## 2024-05-29 ENCOUNTER — Inpatient Hospital Stay (HOSPITAL_COMMUNITY)

## 2024-05-29 ENCOUNTER — Emergency Department (HOSPITAL_BASED_OUTPATIENT_CLINIC_OR_DEPARTMENT_OTHER)

## 2024-05-29 ENCOUNTER — Other Ambulatory Visit: Payer: Self-pay

## 2024-05-29 ENCOUNTER — Encounter (HOSPITAL_BASED_OUTPATIENT_CLINIC_OR_DEPARTMENT_OTHER): Payer: Self-pay

## 2024-05-29 ENCOUNTER — Emergency Department (HOSPITAL_BASED_OUTPATIENT_CLINIC_OR_DEPARTMENT_OTHER)
Admission: EM | Admit: 2024-05-29 | Discharge: 2024-05-29 | Disposition: A | Discharge status: Home / Self Care | Attending: Emergency Medicine | Admitting: Emergency Medicine

## 2024-05-29 DIAGNOSIS — R1030 Lower abdominal pain, unspecified: Secondary | ICD-10-CM | POA: Diagnosis present

## 2024-05-29 DIAGNOSIS — N760 Acute vaginitis: Secondary | ICD-10-CM | POA: Insufficient documentation

## 2024-05-29 DIAGNOSIS — Z3201 Encounter for pregnancy test, result positive: Secondary | ICD-10-CM | POA: Insufficient documentation

## 2024-05-29 DIAGNOSIS — B9689 Other specified bacterial agents as the cause of diseases classified elsewhere: Secondary | ICD-10-CM

## 2024-05-29 DIAGNOSIS — N939 Abnormal uterine and vaginal bleeding, unspecified: Secondary | ICD-10-CM | POA: Insufficient documentation

## 2024-05-29 LAB — CBC WITH DIFFERENTIAL/PLATELET
Abs Immature Granulocytes: 0.03 K/uL (ref 0.00–0.07)
Basophils Absolute: 0 K/uL (ref 0.0–0.1)
Basophils Relative: 0 %
Eosinophils Absolute: 0.2 K/uL (ref 0.0–0.5)
Eosinophils Relative: 2 %
HCT: 39 % (ref 36.0–46.0)
Hemoglobin: 13.4 g/dL (ref 12.0–15.0)
Immature Granulocytes: 0 %
Lymphocytes Relative: 26 %
Lymphs Abs: 2.5 K/uL (ref 0.7–4.0)
MCH: 30.1 pg (ref 26.0–34.0)
MCHC: 34.4 g/dL (ref 30.0–36.0)
MCV: 87.6 fL (ref 80.0–100.0)
Monocytes Absolute: 0.7 K/uL (ref 0.1–1.0)
Monocytes Relative: 7 %
Neutro Abs: 6.4 K/uL (ref 1.7–7.7)
Neutrophils Relative %: 65 %
Platelets: 314 K/uL (ref 150–400)
RBC: 4.45 MIL/uL (ref 3.87–5.11)
RDW: 12.2 % (ref 11.5–15.5)
WBC: 9.8 K/uL (ref 4.0–10.5)
nRBC: 0 % (ref 0.0–0.2)

## 2024-05-29 LAB — WET PREP, GENITAL
Sperm: NONE SEEN
Trich, Wet Prep: NONE SEEN
WBC, Wet Prep HPF POC: <10
Yeast Wet Prep HPF POC: NONE SEEN

## 2024-05-29 LAB — URINALYSIS, ROUTINE W REFLEX MICROSCOPIC
Bacteria, UA: NONE SEEN
Bilirubin Urine: NEGATIVE
Glucose, UA: NEGATIVE mg/dL
Ketones, ur: NEGATIVE mg/dL
Nitrite: NEGATIVE
Protein, ur: 30 mg/dL — AB
Specific Gravity, Urine: 1.031 — ABNORMAL HIGH (ref 1.005–1.030)
pH: 7 (ref 5.0–8.0)

## 2024-05-29 LAB — HCG, QUANTITATIVE, PREGNANCY: hCG, Beta Chain, Quant, S: 43 m[IU]/mL — ABNORMAL HIGH

## 2024-05-29 LAB — PREGNANCY, URINE: Preg Test, Ur: POSITIVE — AB

## 2024-05-29 MED ORDER — METRONIDAZOLE 500 MG PO TABS: 500.0000 mg | ORAL_TABLET | Freq: Two times a day (BID) | ORAL | 0 refills | Status: AC

## 2024-05-29 NOTE — ED Triage Notes (Signed)
 Pt reports starting new BC x2 weeks ago. Pt reports intermittent vaginal bleeding and abd cramping.

## 2024-05-29 NOTE — Discharge Instructions (Addendum)
 Thank for letting us  evaluate you today.  Your blood work is notable for a positive blood pregnancy test.  The level is levels typical for 1 week of pregnancy.  The ultrasound did not show any pregnancy.  Please follow-up at [facility] on Sunday MORNING to trend these levels.  Your wet prep also showed BV.  I provided you with a prescription to treat this.  This is not an STD but it is an overgrowth of bacteria.  Please take as prescribed.  Do not drink any alcohol as this will make you severely sick

## 2024-05-29 NOTE — ED Provider Notes (Signed)
 " [place] EMERGENCY DEPARTMENT AT [facility] Provider Note   CSN: [id number] Arrival date & time: 05/29/24  1021     Patient presents with: Vaginal Bleeding and Abdominal Pain   Virginia Orozco is a 28 y.o. female with past medical history of HTN, BMI 35 presents Emergency Department for evaluation of vaginal bleeding, lower abdominal pain that radiates into back for past 2 weeks.  Just recently started a new birth control pill Emzahn from a provider who prescribed it to her online.  Reports that she has been going through 5-6 pads a day for the past 2 weeks.  Last had protected intercourse 3 weeks ago.  Denies NVD, vaginal symptoms, urinary symptoms, fevers, dizziness, lightheadedness    Vaginal Bleeding Associated symptoms: abdominal pain   Abdominal Pain Associated symptoms: vaginal bleeding        Prior to Admission medications  Medication Sig Start Date End Date Taking? Authorizing Provider  metroNIDAZOLE  (FLAGYL ) 500 MG tablet Take 1 tablet (500 mg total) by mouth 2 (two) times daily. 05/29/24  Yes Minnie Tinnie BRAVO, PA  benzonatate  (TESSALON ) 100 MG capsule Take 1 capsule (100 mg total) by mouth every 8 (eight) hours. Patient not taking: Reported on 05/10/2022 05/07/21   Prosperi, Christian H, PA-C  guaifenesin  (HUMIBID E) 400 MG TABS tablet Take 1 tablet 3 times daily as needed for chest congestion and cough Patient not taking: Reported on 05/10/2022 05/10/21   Joesph Shaver Scales, PA-C  ibuprofen  (ADVIL ) 400 MG tablet Take 1 tablet (400 mg total) by mouth every 6 (six) hours as needed. 05/10/21   Joesph Shaver Scales, PA-C  lidocaine  (XYLOCAINE ) 2 % solution Use as directed 15 mLs in the mouth or throat every 4 (four) hours as needed for mouth pain. Patient not taking: Reported on 05/10/2022 05/07/21   Prosperi, Christian H, PA-C  methocarbamol  (ROBAXIN ) 500 MG tablet Take 1 tablet (500 mg total) by mouth 2 (two) times daily. Patient not taking: Reported on 05/10/2022  03/26/22   Roemhildt, Lorin T, PA-C  naproxen  (NAPROSYN ) 375 MG tablet Take 1 tablet (375 mg total) by mouth 2 (two) times daily. Patient not taking: Reported on 05/10/2022 03/24/21   Randol Simmonds, MD  olopatadine  (PATADAY ) 0.1 % ophthalmic solution Place 1 drop into both eyes 2 (two) times daily. Patient not taking: Reported on 05/10/2022 05/10/21   Joesph Shaver Scales, PA-C  oxyCODONE -acetaminophen  (PERCOCET) 5-325 MG tablet Take 1-2 tablets by mouth every 6 (six) hours as needed for severe pain. Max 6 tabs per day 05/16/22   Fowler, Justin, DPM  venlafaxine XR (EFFEXOR-XR) 37.5 MG 24 hr capsule Take 1 capsule by mouth daily. Patient not taking: Reported on 05/10/2022 10/19/20   [provider]    Allergies: Iodides, Shellfish allergy, Shellfish allergy, and Iodine    Review of Systems  Gastrointestinal:  Positive for abdominal pain.  Genitourinary:  Positive for vaginal bleeding.    Updated Vital Signs BP 133/74 (BP Location: Right Arm)   Pulse 68   Temp 98 F (36.7 C) (Oral)   Resp 18   Ht 5' 5 (1.651 m)   Wt 96.2 kg   LMP 04/16/2024   SpO2 97%   BMI 35.28 kg/m   Physical Exam Vitals and nursing note reviewed. Exam conducted with a chaperone present.  Constitutional:      General: She is not in acute distress.    Appearance: Normal appearance.  HENT:     Head: Normocephalic and atraumatic.  Eyes:  Conjunctiva/sclera: Conjunctivae normal.  Cardiovascular:     Rate and Rhythm: Normal rate.  Pulmonary:     Effort: Pulmonary effort is normal. No respiratory distress.  Abdominal:     Tenderness: There is abdominal tenderness in the right lower quadrant, suprapubic area and left lower quadrant. There is no right CVA tenderness, left CVA tenderness, guarding or rebound.  Genitourinary:    General: Normal vulva.     Exam position: Knee-chest position.     Vagina: Bleeding present.     Cervix: No cervical motion tenderness, discharge, friability or erythema.      Adnexa: Right adnexa normal and left adnexa normal.       Right: No tenderness.         Left: No tenderness.       Comments: No CMT. Os open. No fetal material in vaginal vault Skin:    Coloration: Skin is not jaundiced or pale.  Neurological:     Mental Status: She is alert. Mental status is at baseline.     (all labs ordered are listed, but only abnormal results are displayed) Labs Reviewed  WET PREP, GENITAL - Abnormal; Notable for the following components:      Result Value   Clue Cells Wet Prep HPF POC PRESENT (*)    All other components within normal limits  URINALYSIS, ROUTINE W REFLEX MICROSCOPIC - Abnormal; Notable for the following components:   Specific Gravity, Urine 1.031 (*)    Hgb urine dipstick LARGE (*)    Protein, ur 30 (*)    Leukocytes,Ua TRACE (*)    All other components within normal limits  PREGNANCY, URINE - Abnormal; Notable for the following components:   Preg Test, Ur POSITIVE (*)    All other components within normal limits  HCG, QUANTITATIVE, PREGNANCY - Abnormal; Notable for the following components:   hCG, Beta Chain, Quant, S 43 (*)    All other components within normal limits  CBC WITH DIFFERENTIAL/PLATELET  GC/CHLAMYDIA PROBE AMP ([place]) NOT AT [facility]    EKG: None  Radiology: US  OB LESS THAN 14 WEEKS WITH OB TRANSVAGINAL Result Date: 05/29/2024 EXAM: ULTRASOUND FIRST TRIMESTER TECHNIQUE: Transabdominal and Transvaginal first trimester obstetric pelvic duplex ultrasound was performed with real-time imaging, color flow Doppler imaging, and spectral analysis. COMPARISON: None available. CLINICAL HISTORY: Vaginal bleeding. FINDINGS: UTERUS: The endometrium appears normal measuring 6 mm. No focal myometrial mass. GESTATIONAL SAC(S): No intrauterine gestational sac identified. No subchorionic hemorrhage. RIGHT OVARY: Unremarkable. LEFT OVARY: Unremarkable. FREE FLUID: There is trace free fluid in the pelvis. IMPRESSION: 1. Pregnancy of unknown  location. 2. Trace free fluid in the pelvis. Electronically signed by: Greig Pique MD 05/29/2024 07:02 PM EST RP Workstation: [id number]     Medications Ordered in the ED - No data to display                                  Medical Decision Making Amount and/or Complexity of Data Reviewed Labs: ordered. Radiology: ordered.  Risk Prescription drug management.   Patient presents to the ED for concern of vaginal bleeding, lower abdominal pain, back pain, this involves an extensive number of treatment options, and is a complaint that carries with it a high risk of complications and morbidity.  The differential diagnosis includes pregnancy, symptomatic anemia, ectopic pregnancy, torsion, PID   Co morbidities that complicate the patient evaluation  None   Additional history obtained:  Additional  history obtained from Nursing   External records from outside source obtained and reviewed including triage note   Lab Tests:  I Ordered, and personally interpreted labs.  The pertinent results include:   Beta-hCG 43 No leukocytosis Clue cells on wet prep STD test pending   Imaging Studies ordered:  I ordered imaging studies including TVUS I independently visualized and interpreted imaging which showed  Pregnancy of unknown location. Trace free fluid in the pelvis I agree with the radiologist interpretation    Medicines ordered and prescription drug management:  I ordered medication including Megace for vaginal bleeding Reevaluation of the patient after these medicines showed that the patient stayed the same I have reviewed the patients home medicines and have made adjustments as needed    Consultations Obtained:  I requested consultation with GYN Dr. Cleatus,  and discussed lab and imaging findings as well as pertinent plan - they recommend:  Repeat hCG in 48 to trend at [facility] Metronidazole  for BV   Problem List / ED Course:  Vaginal bleeding Lower abd  pain Back pain Vital signs hemodynamically stable with no fever or tachycardia Hemoglobin stable.  No need for transfusion at this time. Nonsurgical abdomen with no peritoneal signs mild tenderness to lower abdomen GU exam chaperoned and notable for blood in vaginal vault.  Os mildly open.  No CMT. no fetal materials involved. TVUS without any known location of pregnancy.  No no signs of ectopic pregnancy.  However pregnancy is very either early on with a hCG of 43 or patient is currently experiencing a miscarriage.  She told me that her last sexual intercourse was 3 weeks ago and beta-hCG should be higher at this point and she has been bleeding consistently for past 2 weeks so miscarriage is definitely a possibility. Under 12 weeks so RhoGAM is not required Patient will follow-up at [facility] in 48 hours to trend hCG BV Wet prep positive for clue cells I confirm with GYN that patient is safe to have metronidazole  with positive pregnancy test.  They agree that patient can be prescribed denies all. I discussed metronidazole  precautions and to not drink alcohol   Reevaluation:  After the interventions noted above, I reevaluated the patient and found that they have :stayed the same    Dispostion:  After consideration of the diagnostic results and the patients response to treatment, I feel that the patent would benefit from outpatient management with f/u at [facility] in 48hrs to trend hGC.   Discussed ED workup, disposition, return to ED precautions with patient who expresses understanding agrees with plan.  All questions answered to their satisfaction.  They are agreeable to plan.  Discharge instructions provided on paperwork  Final diagnoses:  Positive blood pregnancy test  Vaginal bleeding  Bacterial vaginosis    ED Discharge Orders          Ordered    metroNIDAZOLE  (FLAGYL ) 500 MG tablet  2 times daily        05/29/24 1944             Minnie Tinnie BRAVO, [place] 05/30/24 1532  "

## 2024-06-01 ENCOUNTER — Other Ambulatory Visit: Payer: Self-pay

## 2024-06-01 ENCOUNTER — Ambulatory Visit

## 2024-06-01 ENCOUNTER — Ambulatory Visit: Payer: Self-pay | Admitting: Obstetrics and Gynecology

## 2024-06-01 VITALS — BP 133/89 | HR 69 | Ht 65.0 in | Wt 213.0 lb

## 2024-06-01 DIAGNOSIS — O3680X0 Pregnancy with inconclusive fetal viability, not applicable or unspecified: Secondary | ICD-10-CM | POA: Diagnosis not present

## 2024-06-01 DIAGNOSIS — Z3A00 Weeks of gestation of pregnancy not specified: Secondary | ICD-10-CM

## 2024-06-01 LAB — BETA HCG QUANT (REF LAB): hCG Quant: 77 m[IU]/mL

## 2024-06-01 LAB — GC/CHLAMYDIA PROBE AMP (~~LOC~~) NOT AT ARMC
Chlamydia: NEGATIVE
Comment: NEGATIVE
Comment: NORMAL
Neisseria Gonorrhea: NEGATIVE

## 2024-06-01 NOTE — Progress Notes (Signed)
 Beta HCG Follow-up Visit  Virginia Orozco presents to [facility] for follow-up beta HCG lab. She was seen in [facility] for vaginal bleeding and ABD pain on 05/29/24. TVUS on 05/29/24 revealed pregnancy of unknown location. Patient reports spotting today, denies pain. Discussed with patient that we are following beta HCG levels today. Reviewed MAU precautions with patient. Valid contact number for patient confirmed. I will call the patient with results.    Beta HCG results: 05/29/24 43  06/01/24 77   Results and patient history reviewed with Dr. Zina, who states hcg is rising, advised another Stat HCG RN visit Wednesday, 06/03/24.  Patient called and informed of plan for follow-up. Reviewed MAU precautions with patient. Patient scheduled for Stat RN HCG visit for 06/03/24 at 0910. Patient confirmed scheduled appointment. Patient denies any other needs at this time.   Rosaline Pendleton 06/01/2024 9:27 AM

## 2024-06-03 ENCOUNTER — Ambulatory Visit: Payer: Self-pay | Admitting: Obstetrics & Gynecology

## 2024-06-03 ENCOUNTER — Ambulatory Visit

## 2024-06-03 ENCOUNTER — Other Ambulatory Visit: Payer: Self-pay

## 2024-06-03 VITALS — BP 138/89 | HR 62 | Ht 65.0 in | Wt 213.6 lb

## 2024-06-03 DIAGNOSIS — Z3A00 Weeks of gestation of pregnancy not specified: Secondary | ICD-10-CM | POA: Diagnosis not present

## 2024-06-03 DIAGNOSIS — O3680X0 Pregnancy with inconclusive fetal viability, not applicable or unspecified: Secondary | ICD-10-CM | POA: Diagnosis not present

## 2024-06-03 LAB — BETA HCG QUANT (REF LAB): hCG Quant: 92 m[IU]/mL

## 2024-06-03 NOTE — Progress Notes (Signed)
 Beta HCG Follow-up Visit  Virginia Orozco presents to [facility] for follow-up beta HCG lab. She was seen in [facility] for abdominal pain and vaginal bleeding on 05/29/24. Patient reports scant bright red bleeding; states bleeding has increased spotting/scant today. Patient states she returned to work yesterday and noticed more bleeding. Discussed with patient that we are following beta HCG levels today. Patient denies pain. Valid contact number for patient confirmed. I will call the patient with results. MAU precautions reviewed; patient verbalized understanding.   Beta HCG results: 05/29/24 43  06/01/24 77  06/03/24 92   Results and patient history reviewed with Eldonna, MD who states hcg is rising, but patient needs another Stat hcg Friday 06/05/24. Patient called and informed of plan for follow-up. Patient scheduled for Stat HCG 06/05/24 at 9:40. Patient confirmed scheduled appointment. MAU precautions reviewed; pt verbalizes understanding. Patient states no further questions or concerns.  Devon, RN 06/03/2024

## 2024-06-03 NOTE — Telephone Encounter (Signed)
 Patient has STAT appt on 06/03/24 the follow day in which her concerns were addressed.   Dravon Nott,RN

## 2024-06-03 NOTE — Telephone Encounter (Signed)
 Geoffrey, RN called pt.

## 2024-06-05 ENCOUNTER — Ambulatory Visit

## 2024-06-05 ENCOUNTER — Encounter: Payer: Self-pay | Admitting: Obstetrics and Gynecology

## 2024-06-05 ENCOUNTER — Other Ambulatory Visit: Payer: Self-pay

## 2024-06-05 ENCOUNTER — Ambulatory Visit: Payer: Self-pay | Admitting: Family Medicine

## 2024-06-05 VITALS — BP 130/83 | HR 76 | Wt 214.4 lb

## 2024-06-05 DIAGNOSIS — O3680X0 Pregnancy with inconclusive fetal viability, not applicable or unspecified: Secondary | ICD-10-CM

## 2024-06-05 DIAGNOSIS — Z3A00 Weeks of gestation of pregnancy not specified: Secondary | ICD-10-CM

## 2024-06-05 LAB — BETA HCG QUANT (REF LAB): hCG Quant: 108 m[IU]/mL

## 2024-06-05 NOTE — Progress Notes (Signed)
 Beta HCG Follow-up Visit  Virginia Orozco presents to [facility] for follow-up beta HCG lab. She was seen in [facility] for abdominal pain and vaginal bleeding on 05/29/24. Patient reports light dark brown vaginal bleeding and 3/10 intermittent cramping lower right side of her abdomen today. Discussed with patient that we are following beta HCG levels today. Results will be back in approximately 4 hours. Valid contact number for patient confirmed. Patient gave verbal permission for clinical staff to leave detailed voicemail in case she misses call while she is at work after this visit.  Beta HCG results: 05/29/24  43  06/01/24  77  06/03/24  92  06/05/24  pending   Dr. Cleatus aware results are pending, she will advise patient after reviewing results.   Waddell LITTIE Burows 06/05/2024 9:48 AM

## 2024-06-08 ENCOUNTER — Other Ambulatory Visit: Payer: Self-pay

## 2024-06-08 MED ORDER — FLUCONAZOLE 150 MG PO TABS: 150.0000 mg | ORAL_TABLET | Freq: Once | ORAL | 1 refills | Status: AC

## 2024-06-08 NOTE — Telephone Encounter (Signed)
 Patient agreeable to lab visit 06/11/24 at 10:00am. Rechecking weekly to trend to zero per Dr. Cleatus.   Waddell, RN  06/08/24 09:17     --------------------------- Cleatus Moccasin, MD to [facility] Clinical Pool     06/05/24  5:41 PM Needs HCG in a week.  Thanks, pad

## 2024-06-11 ENCOUNTER — Other Ambulatory Visit: Payer: Self-pay

## 2024-06-11 DIAGNOSIS — O3680X0 Pregnancy with inconclusive fetal viability, not applicable or unspecified: Secondary | ICD-10-CM

## 2024-06-12 LAB — BETA HCG QUANT (REF LAB): hCG Quant: 47 m[IU]/mL

## 2024-06-15 ENCOUNTER — Ambulatory Visit: Payer: Self-pay | Admitting: Obstetrics and Gynecology

## 2024-06-17 ENCOUNTER — Other Ambulatory Visit: Payer: Self-pay | Admitting: *Deleted

## 2024-06-17 ENCOUNTER — Encounter: Payer: Self-pay | Admitting: *Deleted

## 2024-06-17 DIAGNOSIS — O039 Complete or unspecified spontaneous abortion without complication: Secondary | ICD-10-CM

## 2024-06-18 ENCOUNTER — Other Ambulatory Visit

## 2024-06-19 ENCOUNTER — Other Ambulatory Visit: Payer: Self-pay

## 2024-06-25 ENCOUNTER — Other Ambulatory Visit: Payer: Self-pay

## 2024-06-25 ENCOUNTER — Other Ambulatory Visit

## 2024-06-25 DIAGNOSIS — O039 Complete or unspecified spontaneous abortion without complication: Secondary | ICD-10-CM

## 2024-06-26 ENCOUNTER — Ambulatory Visit: Payer: Self-pay | Admitting: Obstetrics and Gynecology

## 2024-06-26 LAB — BETA HCG QUANT (REF LAB): hCG Quant: 18 m[IU]/mL

## 2024-06-26 NOTE — Telephone Encounter (Signed)
 I called patient and left a message I am calling with results and to schedule an appointment, please call office back by 12 as we close for the weekend. Rock Skip PEAK

## 2024-06-26 NOTE — Telephone Encounter (Signed)
-----   Message from Vina Solian, MD sent at 06/26/2024  8:27 AM EST ----- HCG in 2 weeks please
